# Patient Record
Sex: Female | Born: 1935
Health system: Southern US, Community
[De-identification: ages and names within clinical notes are randomized; demographics above are authoritative.]

## PROBLEM LIST (undated history)

## (undated) ENCOUNTER — Emergency Department (HOSPITAL_COMMUNITY): Discharge status: Absent without leave | Payer: Medicare Other

## (undated) DIAGNOSIS — M1990 Unspecified osteoarthritis, unspecified site: Secondary | ICD-10-CM

---

## 2004-04-08 ENCOUNTER — Ambulatory Visit: Payer: Self-pay | Admitting: Family Medicine

## 2005-04-03 ENCOUNTER — Ambulatory Visit: Payer: Self-pay | Admitting: Family Medicine

## 2006-03-04 ENCOUNTER — Ambulatory Visit: Payer: Self-pay | Admitting: Family Medicine

## 2006-03-04 LAB — CONVERTED CEMR LAB
Basophils Relative: 0.7 % (ref 0.0–1.0)
Calcium: 9.8 mg/dL (ref 8.4–10.5)
Chol/HDL Ratio, serum: 4
Creatinine, Ser: 0.7 mg/dL (ref 0.4–1.2)
Glucose, Bld: 107 mg/dL — ABNORMAL HIGH (ref 70–99)
HCT: 45.5 % (ref 36.0–46.0)
Hemoglobin: 15.3 g/dL — ABNORMAL HIGH (ref 12.0–15.0)
LDL DIRECT: 163.4 mg/dL
Lymphocytes Relative: 20.7 % (ref 12.0–46.0)
Monocytes Absolute: 0.4 10*3/uL (ref 0.2–0.7)
Neutro Abs: 4.4 10*3/uL (ref 1.4–7.7)
Neutrophils Relative %: 71.8 % (ref 43.0–77.0)
Potassium: 3.8 meq/L (ref 3.5–5.1)
RDW: 12.3 % (ref 11.5–14.6)
VLDL: 14 mg/dL (ref 0–40)

## 2006-03-14 ENCOUNTER — Ambulatory Visit: Payer: Self-pay | Admitting: Family Medicine

## 2006-07-28 ENCOUNTER — Ambulatory Visit: Payer: Self-pay | Admitting: Family Medicine

## 2007-02-13 DIAGNOSIS — J45909 Unspecified asthma, uncomplicated: Secondary | ICD-10-CM | POA: Insufficient documentation

## 2007-02-13 DIAGNOSIS — Z8709 Personal history of other diseases of the respiratory system: Secondary | ICD-10-CM | POA: Insufficient documentation

## 2007-02-13 DIAGNOSIS — E785 Hyperlipidemia, unspecified: Secondary | ICD-10-CM | POA: Insufficient documentation

## 2007-02-13 DIAGNOSIS — I10 Essential (primary) hypertension: Secondary | ICD-10-CM | POA: Insufficient documentation

## 2007-03-09 ENCOUNTER — Ambulatory Visit: Payer: Self-pay | Admitting: Family Medicine

## 2007-05-08 ENCOUNTER — Telehealth: Payer: Self-pay | Admitting: Family Medicine

## 2007-05-15 ENCOUNTER — Telehealth: Payer: Self-pay | Admitting: Family Medicine

## 2008-01-03 ENCOUNTER — Ambulatory Visit: Payer: Self-pay | Admitting: Family Medicine

## 2008-01-03 DIAGNOSIS — L03119 Cellulitis of unspecified part of limb: Secondary | ICD-10-CM

## 2008-01-03 DIAGNOSIS — L02519 Cutaneous abscess of unspecified hand: Secondary | ICD-10-CM | POA: Insufficient documentation

## 2008-02-15 ENCOUNTER — Telehealth: Payer: Self-pay | Admitting: Family Medicine

## 2008-02-28 ENCOUNTER — Ambulatory Visit: Payer: Self-pay | Admitting: Family Medicine

## 2009-03-05 ENCOUNTER — Ambulatory Visit: Payer: Self-pay | Admitting: Family Medicine

## 2009-04-15 ENCOUNTER — Ambulatory Visit: Payer: Self-pay | Admitting: Family Medicine

## 2009-04-15 LAB — CONVERTED CEMR LAB
Nitrite: NEGATIVE
Specific Gravity, Urine: 1.02
Urobilinogen, UA: 0.2

## 2009-04-16 LAB — CONVERTED CEMR LAB
ALT: 19 units/L (ref 0–35)
BUN: 10 mg/dL (ref 6–23)
Basophils Relative: 0.2 % (ref 0.0–3.0)
CO2: 29 meq/L (ref 19–32)
Chloride: 103 meq/L (ref 96–112)
Eosinophils Relative: 0.6 % (ref 0.0–5.0)
HCT: 42.7 % (ref 36.0–46.0)
Lymphs Abs: 1 10*3/uL (ref 0.7–4.0)
MCV: 93.3 fL (ref 78.0–100.0)
Monocytes Absolute: 0.4 10*3/uL (ref 0.1–1.0)
Platelets: 169 10*3/uL (ref 150.0–400.0)
Potassium: 3.7 meq/L (ref 3.5–5.1)
TSH: 2.73 microintl units/mL (ref 0.35–5.50)
Total Bilirubin: 1.3 mg/dL — ABNORMAL HIGH (ref 0.3–1.2)
Total Protein: 7.4 g/dL (ref 6.0–8.3)
WBC: 6 10*3/uL (ref 4.5–10.5)

## 2010-02-11 ENCOUNTER — Ambulatory Visit: Payer: Self-pay | Admitting: Internal Medicine

## 2010-06-03 ENCOUNTER — Ambulatory Visit
Admission: RE | Admit: 2010-06-03 | Discharge: 2010-06-03 | Payer: Self-pay | Source: Home / Self Care | Attending: Family Medicine | Admitting: Family Medicine

## 2010-06-05 ENCOUNTER — Ambulatory Visit
Admission: RE | Admit: 2010-06-05 | Discharge: 2010-06-05 | Payer: Self-pay | Source: Home / Self Care | Attending: Family Medicine | Admitting: Family Medicine

## 2010-06-25 NOTE — Assessment & Plan Note (Signed)
Summary: FLU SHOT // RS  Nurse Visit   Vitals Entered By: Duard Brady LPN (February 11, 2010 3:03 PM)  Allergies: No Known Drug Allergies  Orders Added: 1)  Flu Vaccine 43yrs + MEDICARE PATIENTS [Q2039] 2)  Administration Flu vaccine - MCR [G0008] Flu Vaccine Consent Questions     Do you have a history of severe allergic reactions to this vaccine? no    Any prior history of allergic reactions to egg and/or gelatin? no    Do you have a sensitivity to the preservative Thimersol? no    Do you have a past history of Guillan-Barre Syndrome? no    Do you currently have an acute febrile illness? no    Have you ever had a severe reaction to latex? no    Vaccine information given and explained to patient? yes    Are you currently pregnant? no    Lot Number:[id number]   Exp Date:11/21/2010   Site Given  Left Deltoid IM.lbmedflu

## 2010-06-25 NOTE — Assessment & Plan Note (Signed)
Summary: CHEST CONGESTION/COUGH/NO FEVER/CJR   Vital Signs:  Patient profile:   77 year old female Height:      58 inches Weight:      115 pounds BMI:     24.12 Temp:     98.0 degrees F oral BP sitting:   120 / 80  (left arm) Cuff size:   regular  Vitals Entered By: Kern Reap CMA Duncan Dull) (June 03, 2010 10:31 AM) CC: cough   CC:  cough.  History of Present Illness: Danielle Myers is an 77 year old single female, who comes in today with a week's history of cough.  She's had no fever, earache, sore throat, etc.  She does have a history of allergic rhinitis and asthma in the past.  She cannot recall anything that triggered her asthma  Allergies: No Known Drug Allergies  Past History:  Past medical, surgical, family and social histories (including risk factors) reviewed for relevance to current acute and chronic problems.  Past Medical History: Reviewed history from 02/13/2007 and no changes required. Macular Degeneration L eye L ear hearing loss Allergies Asthma Hyperlipidemia Hypertension Pneumonia, hx of  Past Surgical History: Reviewed history from 02/13/2007 and no changes required. Sigmoidoscopy Appendectomy  Family History: Reviewed history from 02/13/2007 and no changes required. Family History of Cardiovascular disorder  Social History: Reviewed history from 02/13/2007 and no changes required. Retired Divorced Never Smoked Alcohol use-yes Regular exercise-yes  Review of Systems      See HPI  Physical Exam  General:  Well-developed,well-nourished,in no acute distress; alert,appropriate and cooperative throughout examination Head:  Normocephalic and atraumatic without obvious abnormalities. No apparent alopecia or balding. Eyes:  No corneal or conjunctival inflammation noted. EOMI. Perrla. Funduscopic exam benign, without hemorrhages, exudates or papilledema. Vision grossly normal. Ears:  External ear exam shows no significant lesions or deformities.   Otoscopic examination reveals clear canals, tympanic membranes are intact bilaterally without bulging, retraction, inflammation or discharge. Hearing is grossly normal bilaterally. Nose:  External nasal examination shows no deformity or inflammation. Nasal mucosa are pink and moist without lesions or exudates. Mouth:  Oral mucosa and oropharynx without lesions or exudates.  Teeth in good repair. Neck:  No deformities, masses, or tenderness noted. Chest Wall:  No deformities, masses, or tenderness noted. Lungs:  symmetrical breath sounds, expiratory wheezing bilaterally   Impression & Recommendations:  Problem # 1:  ASTHMA (ICD-493.90) Assessment Deteriorated  Her updated medication list for this problem includes:    Prednisone 20 Mg Tabs (Prednisone) ..... Uad  Complete Medication List: 1)  Hydrochlorothiazide 12.5 Mg Tabs (Hydrochlorothiazide) .... Take 1 tablet by mouth once a day ; cpx when due 2)  Baby Aspirin 81 Mg Chew (Aspirin) 3)  Nasonex 50 Mcg/act Susp (Mometasone furoate) .... 2 puffs per nostrile when needed 4)  Prednisone 20 Mg Tabs (Prednisone) .... Uad 5)  Hydromet 5-1.5 Mg/16ml Syrp (Hydrocodone-homatropine) .... 1/2 tsp three times a day as needed  Patient Instructions: 1)  begin prednisone, take two tablets daily x 3 days, one x 3 days, a half x 3 days, then half a tablet Monday, Wednesday, Friday, for a two week taper. 2)  Drink 20 ounces of water daily. 3)  Hydromet 0.5-teaspoon 3 times a day as needed for cough. 4)  Return Friday for follow-up Prescriptions: HYDROMET 5-1.5 MG/5ML SYRP (HYDROCODONE-HOMATROPINE) 1/2 tsp three times a day as needed  #4oz x 1   Entered and Authorized by:   Roderick Pee MD   Signed by:   Roderick Pee  MD on 06/03/2010   Method used:   Print then Mail to Patient   RxID:   [id number] PREDNISONE 20 MG TABS (PREDNISONE) UAD  #30 x 1   Entered and Authorized by:   Roderick Pee MD   Signed by:   Roderick Pee MD on  06/03/2010   Method used:   Electronically to        [facility]  [phone]* (retail)       [address]       [place], [place]  [address]       Ph: [phone] or [phone]       Fax: [phone]   RxID:   [id number]    Orders Added: 1)  Est. Patient Level IV [87564]

## 2010-06-25 NOTE — Assessment & Plan Note (Signed)
Summary: follow up//alp   Vital Signs:  Patient profile:   77 year old female Weight:      116 pounds Temp:     98.6 degrees F oral BP sitting:   100 / 70  (left arm) Cuff size:   regular  Vitals Entered By: Romualdo Bolk, CMA (AAMA) (June 05, 2010 3:11 PM) CC: follow-up visit   CC:  follow-up visit.  History of Present Illness: Danielle Myers is a 77 year old female, who comes back today for follow-up of a viral syndrome, which triggered her asthma.  We start on prednisone 40 mg daily 3 days ago.  She comes back saying she feels much improved.  No coughing no wheezing  Preventive Screening-Counseling & Management  Alcohol-Tobacco     Smoking Status: never  Caffeine-Diet-Exercise     Does Patient Exercise: yes  Current Medications (verified): 1)  Hydrochlorothiazide 12.5 Mg  Tabs (Hydrochlorothiazide) .... Take 1 Tablet By Mouth Once A Day ; Cpx When Due 2)  Baby Aspirin 81 Mg  Chew (Aspirin) 3)  Nasonex 50 Mcg/act Susp (Mometasone Furoate) .... 2 Puffs Per Nostrile When Needed 4)  Prednisone 20 Mg Tabs (Prednisone) .... Uad 5)  Hydromet 5-1.5 Mg/44ml Syrp (Hydrocodone-Homatropine) .... 1/2 Tsp Three Times A Day As Needed  Allergies (verified): No Known Drug Allergies  Past History:  Past medical, surgical, family and social histories (including risk factors) reviewed for relevance to current acute and chronic problems.  Past Medical History: Reviewed history from 02/13/2007 and no changes required. Macular Degeneration L eye L ear hearing loss Allergies Asthma Hyperlipidemia Hypertension Pneumonia, hx of  Past Surgical History: Reviewed history from 02/13/2007 and no changes required. Sigmoidoscopy Appendectomy  Family History: Reviewed history from 02/13/2007 and no changes required. Family History of Cardiovascular disorder  Social History: Reviewed history from 02/13/2007 and no changes required. Retired Divorced Never Smoked Alcohol  use-yes Regular exercise-yes  Review of Systems      See HPI  Physical Exam  General:  Well-developed,well-nourished,in no acute distress; alert,appropriate and cooperative throughout examination Lungs:  Normal respiratory effort, chest expands symmetrically. Lungs are clear to auscultation, no crackles or wheezes.   Impression & Recommendations:  Problem # 1:  ASTHMA (ICD-493.90) Assessment Improved  Her updated medication list for this problem includes:    Prednisone 20 Mg Tabs (Prednisone) ..... Uad  Complete Medication List: 1)  Hydrochlorothiazide 12.5 Mg Tabs (Hydrochlorothiazide) .... Take 1 tablet by mouth once a day ; cpx when due 2)  Baby Aspirin 81 Mg Chew (Aspirin) 3)  Nasonex 50 Mcg/act Susp (Mometasone furoate) .... 2 puffs per nostrile when needed 4)  Prednisone 20 Mg Tabs (Prednisone) .... Uad 5)  Hydromet 5-1.5 Mg/27ml Syrp (Hydrocodone-homatropine) .... 1/2 tsp three times a day as needed  Patient Instructions: 1)  take one prednisone tablet daily, x 3 days, then a half a tablet daily, x 3 days, then stop. 2)  Please schedule a follow-up appointment in 1 year.   Orders Added: 1)  Est. Patient Level III [82956]

## 2010-07-14 ENCOUNTER — Other Ambulatory Visit: Payer: Self-pay | Admitting: Family Medicine

## 2010-09-08 ENCOUNTER — Ambulatory Visit (INDEPENDENT_AMBULATORY_CARE_PROVIDER_SITE_OTHER): Payer: Medicare Other | Admitting: Family Medicine

## 2010-09-08 ENCOUNTER — Encounter: Payer: Self-pay | Admitting: Family Medicine

## 2010-09-08 VITALS — BP 120/80 | Temp 97.8°F | Ht 60.0 in | Wt 116.0 lb

## 2010-09-08 DIAGNOSIS — L255 Unspecified contact dermatitis due to plants, except food: Secondary | ICD-10-CM

## 2010-09-08 DIAGNOSIS — L237 Allergic contact dermatitis due to plants, except food: Secondary | ICD-10-CM

## 2010-09-08 MED ORDER — PREDNISONE 20 MG PO TABS: ORAL_TABLET | ORAL | Status: DC

## 2010-09-08 NOTE — Patient Instructions (Signed)
Take the prednisone as directed.  Return p.r.n. Also apply small amounts of over-the-counter cortisone cream 3 times daily

## 2010-09-08 NOTE — Progress Notes (Signed)
  Subjective:    Patient ID: Danielle Myers, female    DOB: 02-27-1919, 77 y.o.   MRN: [id number]  HPIAlice is a 77 year old female, who comes in today for a contact dermatitis.  That will not resolve with topical creams for the last two weeks    Review of Systems General and dermatologic review of systems otherwise negative    Objective:   Physical Exam    Well-developed well-nourished, female, in no acute distress.  Examination.  Skin shows diffuse contact dermatitis.  Both forearms    Assessment & Plan:  Contact dermatitis,,,,,,,,,,,,,, prednisone burst and taper return to

## 2011-01-12 ENCOUNTER — Other Ambulatory Visit: Payer: Self-pay | Admitting: Family Medicine

## 2011-03-16 ENCOUNTER — Ambulatory Visit (INDEPENDENT_AMBULATORY_CARE_PROVIDER_SITE_OTHER): Payer: Medicare Other

## 2011-03-16 DIAGNOSIS — Z23 Encounter for immunization: Secondary | ICD-10-CM

## 2011-04-08 ENCOUNTER — Encounter: Payer: Self-pay | Admitting: Internal Medicine

## 2011-04-08 ENCOUNTER — Ambulatory Visit (INDEPENDENT_AMBULATORY_CARE_PROVIDER_SITE_OTHER): Payer: Medicare Other | Admitting: Internal Medicine

## 2011-04-08 DIAGNOSIS — J069 Acute upper respiratory infection, unspecified: Secondary | ICD-10-CM

## 2011-04-08 DIAGNOSIS — J45909 Unspecified asthma, uncomplicated: Secondary | ICD-10-CM

## 2011-04-08 DIAGNOSIS — I10 Essential (primary) hypertension: Secondary | ICD-10-CM

## 2011-04-08 MED ORDER — HYDROCODONE-HOMATROPINE 5-1.5 MG/5ML PO SYRP: 2.5000 mL | ORAL_SOLUTION | Freq: Four times a day (QID) | ORAL | Status: AC | PRN

## 2011-04-08 NOTE — Patient Instructions (Signed)
Get plenty of rest, Drink lots of  clear liquids, and use Tylenol or ibuprofen for fever and discomfort.    Mucinex DM one tablet twice daily  Past medications as directed  Call or return to clinic prn if these symptoms worsen or fail to improve as anticipated.

## 2011-04-08 NOTE — Progress Notes (Signed)
Subjective:    Patient ID: Danielle Myers, female    DOB: September 20, 1918, 77 y.o.   MRN: [id number]  HPI  77 year old patient who is seen today for followup. She was seen at an urgent care 3 days ago and treated with prednisone orally briefly. She is seen today with a chief complaint of persistent cough. She's also had mild sore throat. There is slight postnasal drip but no sputum production. Denies any chest pain shortness of breath fever or chills. She was also prescribed Nasonex.  No past medical history on file.  History   Social History  . Marital Status: Single    Spouse Name: N/A    Number of Children: N/A  . Years of Education: N/A   Occupational History  . Not on file.   Social History Main Topics  . Smoking status: Never Smoker   . Smokeless tobacco: Not on file  . Alcohol Use: Yes  . Drug Use:   . Sexually Active:    Other Topics Concern  . Not on file   Social History Narrative  . No narrative on file    No past surgical history on file.  No family history on file.  No Known Allergies  Current Outpatient Prescriptions on File Prior to Visit  Medication Sig Dispense Refill  . aspirin 81 MG EC tablet Take 81 mg by mouth daily.        . hydrochlorothiazide (,MICROZIDE/HYDRODIURIL,) 12.5 MG capsule TAKE ONE CAPSULE BY MOUTH EVERY DAY  90 capsule  1  . mometasone (NASONEX) 50 MCG/ACT nasal spray 2 sprays by Nasal route daily.        . predniSONE (DELTASONE) 20 MG tablet Two tabs x 3 days, one x 3 days, a half x 3 days, then half a tablet Monday, Wednesday, Friday, for a two week taper  40 tablet  1    BP 110/80  Pulse 88  Temp(Src) 98.1 F (36.7 C) (Oral)  Wt 114 lb (51.71 kg)  SpO2 96%      Review of Systems  Constitutional: Positive for fatigue. Negative for fever and chills.  HENT: Positive for congestion, sore throat and postnasal drip. Negative for hearing loss, rhinorrhea, dental problem, sinus pressure and tinnitus.   Eyes: Negative for pain,  discharge and visual disturbance.  Respiratory: Positive for cough. Negative for shortness of breath.   Cardiovascular: Negative for chest pain, palpitations and leg swelling.  Gastrointestinal: Negative for nausea, vomiting, abdominal pain, diarrhea, constipation, blood in stool and abdominal distention.  Genitourinary: Negative for dysuria, urgency, frequency, hematuria, flank pain, vaginal bleeding, vaginal discharge, difficulty urinating, vaginal pain and pelvic pain.  Musculoskeletal: Negative for joint swelling, arthralgias and gait problem.  Skin: Negative for rash.  Neurological: Negative for dizziness, syncope, speech difficulty, weakness, numbness and headaches.  Hematological: Negative for adenopathy.  Psychiatric/Behavioral: Negative for behavioral problems, dysphoric mood and agitation. The patient is not nervous/anxious.        Objective:   Physical Exam  Constitutional: She is oriented to person, place, and time. She appears well-developed and well-nourished. No distress.       Afebrile. No distress. Easily transfers from a sitting position to the examining table unassisted  HENT:  Head: Normocephalic.  Right Ear: External ear normal.  Left Ear: External ear normal.  Mouth/Throat: Oropharynx is clear and moist.  Eyes: Conjunctivae and EOM are normal. Pupils are equal, round, and reactive to light.  Neck: Normal range of motion. Neck supple. No thyromegaly present.  Cardiovascular: Normal  rate, regular rhythm, normal heart sounds and intact distal pulses.   Pulmonary/Chest: Effort normal and breath sounds normal.       O2 saturation 97  Abdominal: Soft. Bowel sounds are normal. She exhibits no mass. There is no tenderness.  Musculoskeletal: Normal range of motion.  Lymphadenopathy:    She has no cervical adenopathy.  Neurological: She is alert and oriented to person, place, and time.  Skin: Skin is warm and dry. No rash noted.  Psychiatric: She has a normal mood and  affect. Her behavior is normal.          Assessment & Plan:    URI with persistent cough. The patient was treated with Hydromet earlier in the year at a reduced dose which she seemed to tolerate well and was quite helpful. Will refill. Will continue Tylenol or Advil for sore throat. We'll call if unimproved  Hypertension stable. We'll continue diuretic therapy

## 2011-04-09 ENCOUNTER — Ambulatory Visit: Payer: Medicare Other | Admitting: Internal Medicine

## 2011-04-11 ENCOUNTER — Other Ambulatory Visit: Payer: Self-pay | Admitting: Family Medicine

## 2011-06-28 ENCOUNTER — Other Ambulatory Visit: Payer: Self-pay | Admitting: Family Medicine

## 2011-12-27 ENCOUNTER — Other Ambulatory Visit: Payer: Self-pay | Admitting: Family Medicine

## 2012-03-07 ENCOUNTER — Ambulatory Visit (INDEPENDENT_AMBULATORY_CARE_PROVIDER_SITE_OTHER): Payer: Medicare Other

## 2012-03-07 DIAGNOSIS — Z23 Encounter for immunization: Secondary | ICD-10-CM

## 2012-03-17 ENCOUNTER — Other Ambulatory Visit: Payer: Self-pay | Admitting: Family Medicine

## 2012-08-15 ENCOUNTER — Ambulatory Visit (INDEPENDENT_AMBULATORY_CARE_PROVIDER_SITE_OTHER): Payer: Medicare Other | Admitting: Internal Medicine

## 2012-08-15 ENCOUNTER — Encounter: Payer: Self-pay | Admitting: Internal Medicine

## 2012-08-15 VITALS — BP 130/80 | HR 68 | Temp 98.0°F | Resp 18 | Wt 116.0 lb

## 2012-08-15 DIAGNOSIS — J45909 Unspecified asthma, uncomplicated: Secondary | ICD-10-CM

## 2012-08-15 DIAGNOSIS — E785 Hyperlipidemia, unspecified: Secondary | ICD-10-CM

## 2012-08-15 DIAGNOSIS — I10 Essential (primary) hypertension: Secondary | ICD-10-CM

## 2012-08-15 MED ORDER — HYDROCHLOROTHIAZIDE 12.5 MG PO CAPS: ORAL_CAPSULE | ORAL | Status: DC

## 2012-08-15 MED ORDER — MOMETASONE FUROATE 50 MCG/ACT NA SUSP: 2.0000 | Freq: Every day | NASAL | Status: DC

## 2012-08-15 NOTE — Progress Notes (Signed)
Subjective:    Patient ID: Danielle Myers, female    DOB: [birth year], 79 y.o.   MRN: [id number]  HPI  79 year old patient who has not been seen here in over one year. She does remarkably well. She has hypertension treated with diuretic therapy and also has a mild history of dyslipidemia and asthma. She is doing quite well without concerns or complaints. She states that she stays quite busy and denies any exercise limitations. Denies any cardiopulmonary complaints. She also takes low-dose aspirin  History reviewed. No pertinent past medical history.  History   Social History  . Marital Status: Single    Spouse Name: N/A    Number of Children: N/A  . Years of Education: N/A   Occupational History  . Not on file.   Social History Main Topics  . Smoking status: Never Smoker   . Smokeless tobacco: Not on file  . Alcohol Use: Yes  . Drug Use:   . Sexually Active:    Other Topics Concern  . Not on file   Social History Narrative  . No narrative on file    History reviewed. No pertinent past surgical history.  No family history on file.  No Known Allergies  Current Outpatient Prescriptions on File Prior to Visit  Medication Sig Dispense Refill  . aspirin 81 MG EC tablet Take 81 mg by mouth daily.       . hydrochlorothiazide (MICROZIDE) 12.5 MG capsule TAKE ONE CAPSULE BY MOUTH EVERY DAY  30 capsule  0  . mometasone (NASONEX) 50 MCG/ACT nasal spray 2 sprays by Nasal route daily.         No current facility-administered medications on file prior to visit.    BP 130/80  Pulse 68  Temp(Src) 98 F (36.7 C) (Oral)  Resp 18  Wt 116 lb (52.617 kg)  BMI 22.65 kg/m2  SpO2 98%       Review of Systems  Constitutional: Negative.   HENT: Negative for hearing loss, congestion, sore throat, rhinorrhea, dental problem, sinus pressure and tinnitus.   Eyes: Negative for pain, discharge and visual disturbance.  Respiratory: Negative for cough and shortness of breath.    Cardiovascular: Negative for chest pain, palpitations and leg swelling.  Gastrointestinal: Negative for nausea, vomiting, abdominal pain, diarrhea, constipation, blood in stool and abdominal distention.  Genitourinary: Negative for dysuria, urgency, frequency, hematuria, flank pain, vaginal bleeding, vaginal discharge, difficulty urinating, vaginal pain and pelvic pain.  Musculoskeletal: Negative for joint swelling, arthralgias and gait problem.  Skin: Negative for rash.  Neurological: Negative for dizziness, syncope, speech difficulty, weakness, numbness and headaches.  Hematological: Negative for adenopathy.  Psychiatric/Behavioral: Negative for behavioral problems, dysphoric mood and agitation. The patient is not nervous/anxious.        Objective:   Physical Exam  Constitutional: She is oriented to person, place, and time. She appears well-developed and well-nourished.  HENT:  Head: Normocephalic.  Right Ear: External ear normal.  Left Ear: External ear normal.  Mouth/Throat: Oropharynx is clear and moist.  Eyes: Conjunctivae and EOM are normal. Pupils are equal, round, and reactive to light.  Neck: Normal range of motion. Neck supple. No thyromegaly present.  Cardiovascular: Normal rate, regular rhythm, normal heart sounds and intact distal pulses.   Pulmonary/Chest: Effort normal and breath sounds normal.  Abdominal: Soft. Bowel sounds are normal. She exhibits no mass. There is no tenderness.  Musculoskeletal: Normal range of motion.  Lymphadenopathy:    She has no cervical adenopathy.  Neurological: She is  alert and oriented to person, place, and time.  Skin: Skin is warm and dry. No rash noted.  Psychiatric: She has a normal mood and affect. Her behavior is normal.          Assessment & Plan:   Hypertension. Well controlled on diuretic therapy. Will refill History of asthma clinically stable History mild dyslipidemia.  Patient was asked return in the fall for a flu  vaccine and annual exam Medications refilled

## 2012-08-15 NOTE — Patient Instructions (Signed)
Limit your sodium (Salt) intake    It is important that you exercise regularly, at least 20 minutes 3 to 4 times per week.  If you develop chest pain or shortness of breath seek  medical attention.  Please check your blood pressure on a regular basis.  If it is consistently greater than 150/90, please make an office appointment.  Return in 6 months for follow-up   

## 2013-11-13 ENCOUNTER — Other Ambulatory Visit: Payer: Self-pay | Admitting: Internal Medicine

## 2013-12-06 ENCOUNTER — Telehealth: Payer: Self-pay | Admitting: Family Medicine

## 2013-12-06 MED ORDER — HYDROCHLOROTHIAZIDE 12.5 MG PO CAPS: ORAL_CAPSULE | ORAL | Status: DC

## 2013-12-06 NOTE — Telephone Encounter (Signed)
[facility] [place] - [address] Is requesting re-fill on hydrochlorothiazide (MICROZIDE) 12.5 MG capsule

## 2014-02-13 ENCOUNTER — Other Ambulatory Visit: Payer: Self-pay | Admitting: Internal Medicine

## 2014-06-18 ENCOUNTER — Other Ambulatory Visit: Payer: Self-pay | Admitting: Family Medicine

## 2015-02-09 ENCOUNTER — Emergency Department (HOSPITAL_COMMUNITY): Payer: Medicare Other

## 2015-02-09 ENCOUNTER — Emergency Department (HOSPITAL_COMMUNITY)
Admission: EM | Admit: 2015-02-09 | Discharge: 2015-02-09 | Disposition: A | Discharge status: Home / Self Care | Payer: Medicare Other | Attending: Emergency Medicine | Admitting: Emergency Medicine

## 2015-02-09 ENCOUNTER — Encounter (HOSPITAL_COMMUNITY): Payer: Self-pay | Admitting: Emergency Medicine

## 2015-02-09 DIAGNOSIS — Z7951 Long term (current) use of inhaled steroids: Secondary | ICD-10-CM | POA: Diagnosis not present

## 2015-02-09 DIAGNOSIS — Z79899 Other long term (current) drug therapy: Secondary | ICD-10-CM | POA: Diagnosis not present

## 2015-02-09 DIAGNOSIS — M1990 Unspecified osteoarthritis, unspecified site: Secondary | ICD-10-CM | POA: Diagnosis not present

## 2015-02-09 DIAGNOSIS — Z7982 Long term (current) use of aspirin: Secondary | ICD-10-CM | POA: Diagnosis not present

## 2015-02-09 DIAGNOSIS — R410 Disorientation, unspecified: Secondary | ICD-10-CM

## 2015-02-09 DIAGNOSIS — R4182 Altered mental status, unspecified: Secondary | ICD-10-CM | POA: Diagnosis present

## 2015-02-09 HISTORY — DX: Unspecified osteoarthritis, unspecified site: M19.90

## 2015-02-09 LAB — URINALYSIS, ROUTINE W REFLEX MICROSCOPIC
Bilirubin Urine: NEGATIVE
GLUCOSE, UA: NEGATIVE mg/dL
Hgb urine dipstick: NEGATIVE
Ketones, ur: NEGATIVE mg/dL
LEUKOCYTES UA: NEGATIVE
Nitrite: NEGATIVE
PH: 7.5 (ref 5.0–8.0)
PROTEIN: NEGATIVE mg/dL
SPECIFIC GRAVITY, URINE: 1.008 (ref 1.005–1.030)
Urobilinogen, UA: 0.2 mg/dL (ref 0.0–1.0)

## 2015-02-09 LAB — I-STAT CHEM 8, ED
BUN: 14 mg/dL (ref 6–20)
CHLORIDE: 105 mmol/L (ref 101–111)
Calcium, Ion: 1.26 mmol/L (ref 1.13–1.30)
Creatinine, Ser: 0.8 mg/dL (ref 0.44–1.00)
GLUCOSE: 97 mg/dL (ref 65–99)
HCT: 44 % (ref 36.0–46.0)
HEMOGLOBIN: 15 g/dL (ref 12.0–15.0)
POTASSIUM: 4 mmol/L (ref 3.5–5.1)
SODIUM: 142 mmol/L (ref 135–145)
TCO2: 25 mmol/L (ref 0–100)

## 2015-02-09 LAB — DIFFERENTIAL
BASOS ABS: 0 10*3/uL (ref 0.0–0.1)
BASOS PCT: 0 %
EOS ABS: 0.1 10*3/uL (ref 0.0–0.7)
Eosinophils Relative: 1 %
Lymphocytes Relative: 22 %
Lymphs Abs: 1.4 10*3/uL (ref 0.7–4.0)
MONO ABS: 0.6 10*3/uL (ref 0.1–1.0)
MONOS PCT: 10 %
NEUTROS ABS: 4.2 10*3/uL (ref 1.7–7.7)
Neutrophils Relative %: 67 %

## 2015-02-09 LAB — I-STAT TROPONIN, ED: TROPONIN I, POC: 0.01 ng/mL (ref 0.00–0.08)

## 2015-02-09 LAB — RAPID URINE DRUG SCREEN, HOSP PERFORMED
AMPHETAMINES: NOT DETECTED
BENZODIAZEPINES: NOT DETECTED
Barbiturates: NOT DETECTED
COCAINE: NOT DETECTED
OPIATES: NOT DETECTED
Tetrahydrocannabinol: NOT DETECTED

## 2015-02-09 LAB — COMPREHENSIVE METABOLIC PANEL
ALT: 16 U/L (ref 14–54)
AST: 22 U/L (ref 15–41)
Albumin: 3.9 g/dL (ref 3.5–5.0)
Alkaline Phosphatase: 68 U/L (ref 38–126)
Anion gap: 7 (ref 5–15)
BUN: 10 mg/dL (ref 6–20)
CHLORIDE: 107 mmol/L (ref 101–111)
CO2: 27 mmol/L (ref 22–32)
Calcium: 9.6 mg/dL (ref 8.9–10.3)
Creatinine, Ser: 0.79 mg/dL (ref 0.44–1.00)
Glucose, Bld: 100 mg/dL — ABNORMAL HIGH (ref 65–99)
POTASSIUM: 4 mmol/L (ref 3.5–5.1)
SODIUM: 141 mmol/L (ref 135–145)
Total Bilirubin: 0.6 mg/dL (ref 0.3–1.2)
Total Protein: 6.7 g/dL (ref 6.5–8.1)

## 2015-02-09 LAB — APTT: APTT: 32 s (ref 24–37)

## 2015-02-09 LAB — CBC
HEMATOCRIT: 42.3 % (ref 36.0–46.0)
Hemoglobin: 14 g/dL (ref 12.0–15.0)
MCH: 30.2 pg (ref 26.0–34.0)
MCHC: 33.1 g/dL (ref 30.0–36.0)
MCV: 91.2 fL (ref 78.0–100.0)
PLATELETS: 187 10*3/uL (ref 150–400)
RBC: 4.64 MIL/uL (ref 3.87–5.11)
RDW: 13.2 % (ref 11.5–15.5)
WBC: 6.3 10*3/uL (ref 4.0–10.5)

## 2015-02-09 LAB — ETHANOL

## 2015-02-09 LAB — PROTIME-INR
INR: 1.11 (ref 0.00–1.49)
PROTHROMBIN TIME: 14.5 s (ref 11.6–15.2)

## 2015-02-09 MED ORDER — SODIUM CHLORIDE 0.9 % IV SOLN
1000.0000 mg | Freq: Once | INTRAVENOUS | Status: AC
  Administered 2015-02-09: 1000 mg via INTRAVENOUS
  Filled 2015-02-09: qty 10

## 2015-02-09 NOTE — ED Provider Notes (Signed)
Assumed care for the patient for Dr. Clayborne Dana to follow up MRI result with planned D/C if negative. MRI negative. Reviewed HPI. Patient has very well appearance. Sounds unlikely to be seizure by HPI, will D/C patient without Keppra and advise observation by companion present and outpatient follow up.  Arby Barrette, MD 02/09/15 [phone]

## 2015-02-09 NOTE — Discharge Instructions (Signed)
Confusion Confusion is the inability to think with your usual speed or clarity. Confusion may come on quickly or slowly over time. How quickly the confusion comes on depends on the cause. Confusion can be due to any number of causes. CAUSES   Concussion, head injury, or head trauma.  Seizures.  Stroke.  Fever.  Brain tumor.  Age related decreased brain function (dementia).  Heightened emotional states like rage or terror.  Mental illness in which the person loses the ability to determine what is real and what is not (hallucinations).  Infections such as a urinary tract infection (UTI).  Toxic effects from alcohol, drugs, or prescription medicines.  Dehydration and an imbalance of salts in the body (electrolytes).  Lack of sleep.  Low blood sugar (diabetes).  Low levels of oxygen from conditions such as chronic lung disorders.  Drug interactions or other medicine side effects.  Nutritional deficiencies, especially niacin, thiamine, vitamin C, or vitamin B.  Sudden drop in body temperature (hypothermia).  Change in routine, such as when traveling or hospitalized. SIGNS AND SYMPTOMS  People often describe their thinking as cloudy or unclear when they are confused. Confusion can also include feeling disoriented. That means you are unaware of where or who you are. You may also not know what the date or time is. If confused, you may also have difficulty paying attention, remembering, and making decisions. Some people also act aggressively when they are confused.  DIAGNOSIS  The medical evaluation of confusion may include:  Blood and urine tests.  X-rays.  Brain and nervous system tests.  Analyzing your brain waves (electroencephalogram or EEG).  Magnetic resonance imaging (MRI) of your head.  Computed tomography (CT) scan of your head.  Mental status tests in which your health care provider may ask many questions. Some of these questions may seem silly or strange,  but they are a very important test to help diagnose and treat confusion. TREATMENT  An admission to the hospital may not be needed, but a person with confusion should not be left alone. Stay with a family member or friend until the confusion clears. Avoid alcohol, pain relievers, or sedative drugs until you have fully recovered. Do not drive until directed by your health care provider. HOME CARE INSTRUCTIONS  What family and friends can do:  To find out if someone is confused, ask the person to state his or her name, age, and the date. If the person is unsure or answers incorrectly, he or she is confused.  Always introduce yourself, no matter how well the person knows you.  Often remind the person of his or her location.  Place a calendar and clock near the confused person.  Help the person with his or her medicines. You may want to use a pill box, an alarm as a reminder, or give the person each dose as prescribed.  Talk about current events and plans for the day.  Try to keep the environment calm, quiet, and peaceful.  Make sure the person keeps follow-up visits with his or her health care provider. PREVENTION  Ways to prevent confusion:  Avoid alcohol.  Eat a balanced diet.  Get enough sleep.  Take medicine only as directed by your health care provider.  Do not become isolated. Spend time with other people and make plans for your days.  Keep careful watch on your blood sugar levels if you are diabetic. SEEK IMMEDIATE MEDICAL CARE IF:   You develop severe headaches, repeated vomiting, seizures, blackouts, or   slurred speech.  There is increasing confusion, weakness, numbness, restlessness, or personality changes.  You develop a loss of balance, have marked dizziness, feel uncoordinated, or fall.  You have delusions, hallucinations, or develop severe anxiety.  Your family members think you need to be rechecked. Document Released: 06/17/2004 Document Revised: 09/24/2013  Document Reviewed: 06/15/2013 Exit[employer] Patient Information 2015 [employer]. This information is not intended to replace advice given to you by your health care provider. Make sure you discuss any questions you have with your health care provider.  

## 2015-02-09 NOTE — ED Notes (Signed)
Patient returned from MRI.

## 2015-02-09 NOTE — ED Notes (Signed)
Patient transported to MRI 

## 2015-02-09 NOTE — ED Notes (Signed)
Patient transported to CT 

## 2015-02-09 NOTE — ED Provider Notes (Addendum)
CSN: [id number]     Arrival date & time 02/09/15  1309 History   First MD Initiated Contact with Patient 02/09/15 1312     Chief Complaint  Patient presents with  . Altered Mental Status     (Consider location/radiation/quality/duration/timing/severity/associated sxs/prior Treatment) Patient is a 81 y.o. female presenting with neurologic complaint.  Neurologic Problem Pertinent negatives include no chest pain, no abdominal pain, no headaches and no shortness of breath.    81 year old female had a period time where she would answer questions appropriately which is Saying I am okay I am okay to everything no matter What was asked. Friend called EMS when EMS arrived patient was acting same way. And then during the ride here patient became more interactive and started talking more. Here the patient answers questions appropriately. States she doesn't have any pain anywhere and she feels "you are making too much out of nothing". No recent fevers, vomiting, abdominal pain, chest pain or other symptoms. No history fo same per patient or her neighbor at bedside.   Past Medical History  Diagnosis Date  . Arthritis    History reviewed. No pertinent past surgical history. History reviewed. No pertinent family history. Social History  Substance Use Topics  . Smoking status: Never Smoker   . Smokeless tobacco: None  . Alcohol Use: Yes   OB History    No data available     Review of Systems  Constitutional: Negative for fever and activity change.  HENT: Negative for congestion and facial swelling.   Eyes: Negative for discharge and redness.  Respiratory: Negative for cough and shortness of breath.   Cardiovascular: Negative for chest pain and palpitations.  Gastrointestinal: Negative for abdominal pain and abdominal distention.  Endocrine: Negative for polydipsia and polyuria.  Genitourinary: Negative for dysuria and menstrual problem.  Musculoskeletal: Negative for back pain and joint  swelling.  Skin: Negative for color change and wound.  Neurological: Negative for dizziness, tremors, light-headedness and headaches.      Allergies  Review of patient's allergies indicates no known allergies.  Home Medications   Prior to Admission medications   Medication Sig Start Date End Date Taking? Authorizing Provider  aspirin 81 MG EC tablet Take 81 mg by mouth daily.    Yes Historical Provider, MD  Multiple Vitamin (MULTIVITAMIN) tablet Take 1 tablet by mouth daily.   Yes Historical Provider, MD  hydrochlorothiazide (MICROZIDE) 12.5 MG capsule TAKE ONE CAPSULE BY MOUTH EVERY DAY Patient taking differently: Take 12.5 mg by mouth daily. TAKE ONE CAPSULE BY MOUTH EVERY DAY 12/06/13   Roderick Pee, MD  hydrochlorothiazide (MICROZIDE) 12.5 MG capsule TAKE 1 CAPSULE BY MOUTH EVERY DAY 02/14/14   Roderick Pee, MD  mometasone (NASONEX) 50 MCG/ACT nasal spray Place 2 sprays into the nose daily. 08/15/12   Gordy Savers, MD   BP 116/52 mmHg  Pulse 80  Temp(Src) 98.6 F (37 C) (Oral)  Resp 16  SpO2 99% Physical Exam  Constitutional: She appears well-developed and well-nourished.  HENT:  Head: Normocephalic and atraumatic.  Eyes: Conjunctivae and EOM are normal. Right eye exhibits no discharge. Left eye exhibits no discharge.  Cardiovascular: Normal rate and regular rhythm.   Pulmonary/Chest: Effort normal and breath sounds normal. No respiratory distress.  Abdominal: Soft. She exhibits no distension. There is no tenderness. There is no rebound.  Musculoskeletal: Normal range of motion. She exhibits no edema or tenderness.  Neurological: She is alert.  No altered mental status, able to give full  seemingly accurate history.  Face is symmetric, EOM's intact, pupils equal and reactive, vision intact, tongue and uvula midline without deviation Upper and Lower extremity motor 5/5, intact pain perception in distal extremities, 1+ symmeetric reflexes in biceps, patella tendons.  Finger to nose normal, heel to shin normal. Walks slowly without assistance or evident ataxia.  Skin: Skin is warm and dry.  Nursing note and vitals reviewed.   ED Course  Procedures (including critical care time) Labs Review Labs Reviewed  COMPREHENSIVE METABOLIC PANEL - Abnormal; Notable for the following:    Glucose, Bld 100 (*)    All other components within normal limits  ETHANOL  PROTIME-INR  APTT  CBC  DIFFERENTIAL  URINE RAPID DRUG SCREEN, HOSP PERFORMED  URINALYSIS, ROUTINE W REFLEX MICROSCOPIC (NOT AT [facility])  I-STAT CHEM 8, ED  I-STAT TROPOININ, ED    Imaging Review Ct Head Wo Contrast  02/09/2015   CLINICAL DATA:  Confusion.  EXAM: CT HEAD WITHOUT CONTRAST  TECHNIQUE: Contiguous axial images were obtained from the base of the skull through the vertex without intravenous contrast.  COMPARISON:  None.  FINDINGS: Ventricles are normal configuration. There is ventricular and sulcal enlargement reflecting mild generalized atrophy. There are no parenchymal masses or mass effect. There mild areas of patchy white matter hypoattenuation consistent with chronic microvascular ischemic change. There is no evidence of an infarct.  There are no extra-axial masses or abnormal fluid collections.  There is no intracranial hemorrhage.  Visualized sinuses and mastoid air cells are clear. No skull lesion.  IMPRESSION: 1. No acute intracranial abnormalities. 2. Atrophy and mild chronic microvascular ischemic change.   Electronically Signed   By: Amie Portland M.D.   On: 02/09/2015 14:20   I have personally reviewed and evaluated these images and lab results as part of my medical decision-making.   EKG Interpretation   Date/Time:  Sunday February 09 2015 13:21:08 EDT Ventricular Rate:  73 PR Interval:  131 QRS Duration: 124 QT Interval:  425 QTC Calculation: 468 R Axis:   -70 Text Interpretation:  Sinus rhythm Right bundle branch block Inferior  infarct, old Confirmed by [facility] MD, Barbara Cower  [phone]) on 02/09/2015 1:42:56 PM      MDM   Final diagnoses:  Confusion with non-focal neuro exam    Concern for possible TIA will workup accordingly and d/w neurology with results.  Spoke with neurology. Will get MRI to r/o stroke. Feels it is more likely seizure, would recommend starting keppra and following up as an outpatient.  Care transferred to Dr. Donnald Garre awaiting MRI results with likely d/c and neuro follow up on Keppra.    Marily Memos, MD 02/11/15 1625  Marily Memos, MD 02/11/15 [phone]

## 2015-02-09 NOTE — ED Notes (Signed)
A friend, [employer] [phone]), called EMS for confusion: Lyla picked her up for church. Normal, conversive self at church. After church [employer] was going to drive her home, but the patient walked home; however, she usually walks home from church every Sunday (1 mile). So Lyla drove to her house and she was seated inside and would only say "I'm ok" to every question. On EMS arrival she was oriented x1 and would only answer "I'm ok"... Would not answer birthday or any other questions. CBG 91. In route she began to come around and converse with EMS crew. Neighbor at bedside now and reports she seems at baseline. Oriented to self, place and situation, not time. Speaking in full sentences now. Reports she hasn't eaten today.

## 2017-10-05 ENCOUNTER — Emergency Department (HOSPITAL_COMMUNITY): Payer: Medicare Other

## 2017-10-05 ENCOUNTER — Other Ambulatory Visit: Payer: Self-pay

## 2017-10-05 ENCOUNTER — Emergency Department (HOSPITAL_COMMUNITY)
Admission: EM | Admit: 2017-10-05 | Discharge: 2017-10-05 | Disposition: A | Discharge status: Home / Self Care | Payer: Medicare Other | Attending: Emergency Medicine | Admitting: Emergency Medicine

## 2017-10-05 ENCOUNTER — Encounter (HOSPITAL_COMMUNITY): Payer: Self-pay | Admitting: Emergency Medicine

## 2017-10-05 DIAGNOSIS — I10 Essential (primary) hypertension: Secondary | ICD-10-CM | POA: Insufficient documentation

## 2017-10-05 DIAGNOSIS — S098XXA Other specified injuries of head, initial encounter: Secondary | ICD-10-CM | POA: Diagnosis not present

## 2017-10-05 DIAGNOSIS — Y92009 Unspecified place in unspecified non-institutional (private) residence as the place of occurrence of the external cause: Secondary | ICD-10-CM | POA: Insufficient documentation

## 2017-10-05 DIAGNOSIS — Z79899 Other long term (current) drug therapy: Secondary | ICD-10-CM | POA: Insufficient documentation

## 2017-10-05 DIAGNOSIS — R0781 Pleurodynia: Secondary | ICD-10-CM | POA: Diagnosis not present

## 2017-10-05 DIAGNOSIS — Y939 Activity, unspecified: Secondary | ICD-10-CM | POA: Diagnosis not present

## 2017-10-05 DIAGNOSIS — S299XXA Unspecified injury of thorax, initial encounter: Secondary | ICD-10-CM | POA: Diagnosis not present

## 2017-10-05 DIAGNOSIS — J45909 Unspecified asthma, uncomplicated: Secondary | ICD-10-CM | POA: Diagnosis not present

## 2017-10-05 DIAGNOSIS — Z7982 Long term (current) use of aspirin: Secondary | ICD-10-CM | POA: Insufficient documentation

## 2017-10-05 DIAGNOSIS — S0181XA Laceration without foreign body of other part of head, initial encounter: Secondary | ICD-10-CM | POA: Diagnosis not present

## 2017-10-05 DIAGNOSIS — F0390 Unspecified dementia without behavioral disturbance: Secondary | ICD-10-CM | POA: Diagnosis not present

## 2017-10-05 DIAGNOSIS — S0990XA Unspecified injury of head, initial encounter: Secondary | ICD-10-CM | POA: Diagnosis not present

## 2017-10-05 DIAGNOSIS — Y999 Unspecified external cause status: Secondary | ICD-10-CM | POA: Diagnosis not present

## 2017-10-05 DIAGNOSIS — S0101XA Laceration without foreign body of scalp, initial encounter: Secondary | ICD-10-CM | POA: Insufficient documentation

## 2017-10-05 DIAGNOSIS — W19XXXA Unspecified fall, initial encounter: Secondary | ICD-10-CM | POA: Diagnosis not present

## 2017-10-05 DIAGNOSIS — S0003XA Contusion of scalp, initial encounter: Secondary | ICD-10-CM | POA: Diagnosis not present

## 2017-10-05 DIAGNOSIS — S199XXA Unspecified injury of neck, initial encounter: Secondary | ICD-10-CM | POA: Diagnosis not present

## 2017-10-05 DIAGNOSIS — R0789 Other chest pain: Secondary | ICD-10-CM | POA: Diagnosis not present

## 2017-10-05 LAB — URINALYSIS, ROUTINE W REFLEX MICROSCOPIC
Bacteria, UA: NONE SEEN
Bilirubin Urine: NEGATIVE
GLUCOSE, UA: NEGATIVE mg/dL
KETONES UR: 20 mg/dL — AB
Leukocytes, UA: NEGATIVE
NITRITE: NEGATIVE
PH: 7 (ref 5.0–8.0)
Protein, ur: NEGATIVE mg/dL
Specific Gravity, Urine: 1.015 (ref 1.005–1.030)

## 2017-10-05 LAB — BASIC METABOLIC PANEL
Anion gap: 11 (ref 5–15)
BUN: 9 mg/dL (ref 6–20)
CALCIUM: 9.4 mg/dL (ref 8.9–10.3)
CO2: 25 mmol/L (ref 22–32)
CREATININE: 0.84 mg/dL (ref 0.44–1.00)
Chloride: 102 mmol/L (ref 101–111)
GFR calc Af Amer: >60 mL/min (ref >60)
GFR, EST NON AFRICAN AMERICAN: 56 mL/min — AB (ref >60)
GLUCOSE: 104 mg/dL — AB (ref 65–99)
Potassium: 4.2 mmol/L (ref 3.5–5.1)
Sodium: 138 mmol/L (ref 135–145)

## 2017-10-05 LAB — CBC
HCT: 46.1 % — ABNORMAL HIGH (ref 36.0–46.0)
Hemoglobin: 14.9 g/dL (ref 12.0–15.0)
MCH: 28.7 pg (ref 26.0–34.0)
MCHC: 32.3 g/dL (ref 30.0–36.0)
MCV: 88.7 fL (ref 78.0–100.0)
PLATELETS: 171 10*3/uL (ref 150–400)
RBC: 5.2 MIL/uL — ABNORMAL HIGH (ref 3.87–5.11)
RDW: 13.2 % (ref 11.5–15.5)
WBC: 8.5 10*3/uL (ref 4.0–10.5)

## 2017-10-05 LAB — CBG MONITORING, ED: Glucose-Capillary: 102 mg/dL — ABNORMAL HIGH (ref 65–99)

## 2017-10-05 MED ORDER — ACETAMINOPHEN 325 MG PO TABS
650.0000 mg | ORAL_TABLET | Freq: Once | ORAL | Status: AC
  Administered 2017-10-05: 650 mg via ORAL
  Filled 2017-10-05: qty 2

## 2017-10-05 NOTE — ED Notes (Signed)
Blood cleansed from face per NT - pt tolerated well; pt assisted to wheelchair per RN with caregiver at bedside who is responsible party that will transport pt home

## 2017-10-05 NOTE — ED Notes (Signed)
Care assumed at this time; resting quietly on stretcher - MD and NT in cleansing and assessing head wound; neighbors at bedside

## 2017-10-05 NOTE — ED Notes (Signed)
CSW in to speak with pt

## 2017-10-05 NOTE — Discharge Instructions (Addendum)
See your Physician for recheck.  Return if any problems.  °

## 2017-10-05 NOTE — Discharge Planning (Signed)
Carlisha Wisler J. Lucretia Roers, RN, BSN, [employer] [phone] Spoke with pt and POA/neighbor Kathlene November) at bedside regarding discharge planning for [facility]. Offered pt list of home health agencies to choose from.  Pt and Kathlene November chose [facility] to render services. Ayesha Rumpf, RN of [facility]  notified. Patient made aware that [facility]  will be in contact in 24-48 hours.  No DME needs identified at this time.

## 2017-10-05 NOTE — Progress Notes (Addendum)
9:08am- CSW and RNCM spoke with pt and neighbor Kathlene November) at bedside. Neighbor expressed that he has been living in the same neighborhood as pt for   years and he helps pt daily. Kathlene November reports that he takes pt breakfast and lunch daily as well as takes pt on outings with him. Kathlene November expresses that he DOES NOT really want pt placed as he expressed not doing that to pt however Kathlene November also expressed that he wants pt safe. At this time RNCM has set up [facility] services for pt. There are no further CSW needs. CSW will sign off.   CSW went to speak with pt at bedside. CSW attempted to gather collateral information from pt however pt was consumed with getting lines off of self. CSW tried multiple times to redirect pt back to conversation with CSW. At many efforts pt was able to express that pt doesn't feel that pt is needing help and then mentioned that pt would like to have an extra set of hands to help pt sometimes. CSW as well as NT asked pt about SNF placement and pt declined this offer at this time as pt expressed "I have a home honey". CSW to try and gather more information on pt's needs from neighbor once neighbor returns to the ED.   CSW will continue to follow for any further needs.   Claude Manges Laylani Pudwill, MSW, LCSW-A Emergency Department Clinical Social Worker [phone]

## 2017-10-05 NOTE — ED Provider Notes (Signed)
[facility] EMERGENCY DEPARTMENT Provider Note   CSN: [id number] Arrival date & time: 10/05/17  0509     History   Chief Complaint Chief Complaint  Patient presents with  . Fall  . Altered Mental Status    HPI Danielle Myers is a 84 y.o. female.  HPI  This is a 84 year old female with a history of hypertension and hyperlipidemia who presents after a fall.  Patient brought in by EMS.  History of dementia but lives alone.  She ambulated independently to her neighbor's house after falling.  Neighbors noted blood on her head.  Patient reports falling.  She states that she tripped and fell.  She does report some left chest wall pain.  She denies loss of consciousness.  She denies dizziness, shortness of breath.  She denies being on anticoagulants.  She denies any head or neck pain.  Denies nausea or vomiting.  Patient is able to provide history but is technically only oriented to herself.  She denies any hip or leg pain.  Past Medical History:  Diagnosis Date  . Arthritis     Patient Active Problem List   Diagnosis Date Noted  . Contact dermatitis due to poison ivy 09/08/2010  . CELLULITIS, HAND, LEFT 01/03/2008  . HYPERLIPIDEMIA 02/13/2007  . HYPERTENSION 02/13/2007  . ASTHMA 02/13/2007  . PNEUMONIA, HX OF 02/13/2007    History reviewed. No pertinent surgical history.   OB History   None      Home Medications    Prior to Admission medications   Medication Sig Start Date End Date Taking? Authorizing Provider  aspirin 81 MG EC tablet Take 81 mg by mouth daily.     [provider]  hydrochlorothiazide (MICROZIDE) 12.5 MG capsule TAKE ONE CAPSULE BY MOUTH EVERY DAY Patient taking differently: Take 12.5 mg by mouth daily. TAKE ONE CAPSULE BY MOUTH EVERY DAY 12/06/13   Roderick Pee, MD  hydrochlorothiazide (MICROZIDE) 12.5 MG capsule TAKE 1 CAPSULE BY MOUTH EVERY DAY 02/14/14   Roderick Pee, MD  mometasone (NASONEX) 50 MCG/ACT nasal spray  Place 2 sprays into the nose daily. 08/15/12   Gordy Savers, MD  Multiple Vitamin (MULTIVITAMIN) tablet Take 1 tablet by mouth daily.    [provider]    Family History History reviewed. No pertinent family history.  Social History Social History   Tobacco Use  . Smoking status: Never Smoker  . Smokeless tobacco: Never Used  Substance Use Topics  . Alcohol use: Yes  . Drug use: Not on file     Allergies   Patient has no known allergies.   Review of Systems Review of Systems  Constitutional: Negative for fever.  Respiratory: Negative for shortness of breath.   Cardiovascular: Positive for chest pain.  Gastrointestinal: Negative for abdominal pain, nausea and vomiting.  Musculoskeletal: Negative for neck pain.  Neurological: Negative for syncope and headaches.  All other systems reviewed and are negative.    Physical Exam Updated Vital Signs BP 105/90   Pulse 83   Temp 97.8 F (36.6 C) (Oral)   Resp 18   Ht  (1.575 m)   Wt 59 kg (130 lb)   SpO2 95%   BMI 23.78 kg/m   Physical Exam  Constitutional:  Elderly, nontoxic-appearing, no acute distress  HENT:  Head: Normocephalic.  Mouth/Throat: Oropharynx is clear and moist.  Amount of blood noted over the right side of the head, tenderness palpation over the right posterior occiput  Eyes:  Pupils are equal, round, and reactive to light. EOM are normal.  Neck:  Cervical collar in place, no midline C-spine tenderness to palpation  Cardiovascular: Normal rate, regular rhythm and normal heart sounds.  Pulmonary/Chest: Effort normal and breath sounds normal. No respiratory distress. She has no wheezes. She exhibits tenderness.  No crepitus or overlying skin changes  Abdominal: Soft. Bowel sounds are normal. There is no tenderness. There is no guarding.  Musculoskeletal:  Normal range of motion bilateral hips and knees, no deformities noted  Neurological: She is alert.  Oriented x1, cranial  nerves II through XII intact, 5 out of 5 strength in all 4 extremities  Skin: Skin is warm and dry.  Psychiatric: She has a normal mood and affect.  Nursing note and vitals reviewed.    ED Treatments / Results  Labs (all labs ordered are listed, but only abnormal results are displayed) Labs Reviewed  BASIC METABOLIC PANEL - Abnormal; Notable for the following components:      Result Value   Glucose, Bld 104 (*)    GFR calc non Af Amer 56 (*)    All other components within normal limits  CBC - Abnormal; Notable for the following components:   RBC 5.20 (*)    HCT 46.1 (*)    All other components within normal limits  CBG MONITORING, ED - Abnormal; Notable for the following components:   Glucose-Capillary 102 (*)    All other components within normal limits  URINALYSIS, ROUTINE W REFLEX MICROSCOPIC    EKG EKG Interpretation  Date/Time:  Wednesday Oct 05 2017 05:32:57 EDT Ventricular Rate:  87 PR Interval:    QRS Duration: 117 QT Interval:  406 QTC Calculation: 489 R Axis:   -69 Text Interpretation:  Sinus rhythm Incomplete right bundle branch block Anterolateral infarct, old Similar to prior Confirmed by Ross Marcus ([id number]) on 10/05/2017 6:11:08 AM Also confirmed by Ross Marcus ([id number]), editor Elita Quick [phone])  on 10/05/2017 6:53:01 AM   Radiology Dg Chest 2 View  Result Date: 10/05/2017 CLINICAL DATA:  84 y/o  F; fall with left-sided rib pain. EXAM: CHEST - 2 VIEW COMPARISON:  02/09/2015 chest radiograph. FINDINGS: Stable normal cardiac silhouette given projection and technique. Uncoiled aorta. Aortic atherosclerosis with calcification. Stable mildly elevated right hemidiaphragm. Chronic interstitial pulmonary edema is stable. No consolidation, effusion, or pneumothorax. No acute fracture identified. IMPRESSION: No acute fracture identified. If clinically indicated, consider dedicated rib radiographs. Stable chronic interstitial pulmonary edema and aortic  atherosclerosis. Electronically Signed   By: Mitzi Hansen M.D.   On: 10/05/2017 06:25   Ct Head Wo Contrast  Result Date: 10/05/2017 CLINICAL DATA:  84 y/o F; unwitnessed fall with injury to the right side of the head. EXAM: CT HEAD WITHOUT CONTRAST CT CERVICAL SPINE WITHOUT CONTRAST TECHNIQUE: Multidetector CT imaging of the head and cervical spine was performed following the standard protocol without intravenous contrast. Multiplanar CT image reconstructions of the cervical spine were also generated. COMPARISON:  02/09/2015 CT and MRI of the head. FINDINGS: CT HEAD FINDINGS Brain: No evidence of acute infarction, hemorrhage, hydrocephalus, extra-axial collection or mass lesion/mass effect. Interval mild progression of chronic microvascular ischemic changes and parenchymal volume loss of the brain. Vascular: Calcific atherosclerosis of carotid siphons. No hyperdense vessel identified. Skull: Right parietal region scalp contusion. No calvarial fracture. Sinuses/Orbits: No acute finding. Partial opacification of the external auditory canals, likely cerumen. Other: None. CT CERVICAL SPINE FINDINGS Alignment: C6-7 and C7-T1 grade 1 anterolisthesis. Skull base and vertebrae: No  acute fracture. No primary bone lesion or focal pathologic process. Soft tissues and spinal canal: No prevertebral fluid or swelling. No visible canal hematoma. Disc levels: Cervical spondylosis with moderate to severe discogenic degenerative changes from C4 through C7 and left-greater-than-right facet hypertrophy. Uncovertebral and facet hypertrophy bilaterally results in bony foraminal encroachment from C3-C5. No high-grade bony canal stenosis. Upper chest: Interlobular septal thickening and patchy ground-glass opacification in the lung apices compatible with mild interstitial pulmonary edema. Other: Multinodular thyroid goiter. IMPRESSION: 1. Right parietal region scalp contusion. 2. No acute intracranial abnormality or  calvarial fracture identified. 3. No acute fracture or dislocation of the cervical spine. 4. Mild progression of chronic microvascular ischemic changes and parenchymal volume loss of the brain from 2016. 5. Cervical spondylosis with multilevel disc and facet degenerative changes. No high-grade bony canal stenosis. 6. Mild interstitial pulmonary edema. 7. Multinodular thyroid goiter. Electronically Signed   By: Mitzi Hansen M.D.   On: 10/05/2017 06:54   Ct Cervical Spine Wo Contrast  Result Date: 10/05/2017 CLINICAL DATA:  84 y/o F; unwitnessed fall with injury to the right side of the head. EXAM: CT HEAD WITHOUT CONTRAST CT CERVICAL SPINE WITHOUT CONTRAST TECHNIQUE: Multidetector CT imaging of the head and cervical spine was performed following the standard protocol without intravenous contrast. Multiplanar CT image reconstructions of the cervical spine were also generated. COMPARISON:  02/09/2015 CT and MRI of the head. FINDINGS: CT HEAD FINDINGS Brain: No evidence of acute infarction, hemorrhage, hydrocephalus, extra-axial collection or mass lesion/mass effect. Interval mild progression of chronic microvascular ischemic changes and parenchymal volume loss of the brain. Vascular: Calcific atherosclerosis of carotid siphons. No hyperdense vessel identified. Skull: Right parietal region scalp contusion. No calvarial fracture. Sinuses/Orbits: No acute finding. Partial opacification of the external auditory canals, likely cerumen. Other: None. CT CERVICAL SPINE FINDINGS Alignment: C6-7 and C7-T1 grade 1 anterolisthesis. Skull base and vertebrae: No acute fracture. No primary bone lesion or focal pathologic process. Soft tissues and spinal canal: No prevertebral fluid or swelling. No visible canal hematoma. Disc levels: Cervical spondylosis with moderate to severe discogenic degenerative changes from C4 through C7 and left-greater-than-right facet hypertrophy. Uncovertebral and facet hypertrophy  bilaterally results in bony foraminal encroachment from C3-C5. No high-grade bony canal stenosis. Upper chest: Interlobular septal thickening and patchy ground-glass opacification in the lung apices compatible with mild interstitial pulmonary edema. Other: Multinodular thyroid goiter. IMPRESSION: 1. Right parietal region scalp contusion. 2. No acute intracranial abnormality or calvarial fracture identified. 3. No acute fracture or dislocation of the cervical spine. 4. Mild progression of chronic microvascular ischemic changes and parenchymal volume loss of the brain from 2016. 5. Cervical spondylosis with multilevel disc and facet degenerative changes. No high-grade bony canal stenosis. 6. Mild interstitial pulmonary edema. 7. Multinodular thyroid goiter. Electronically Signed   By: Mitzi Hansen M.D.   On: 10/05/2017 06:54    Procedures Procedures (including critical care time)  Medications Ordered in ED Medications - No data to display   Initial Impression / Assessment and Plan / ED Course  I have reviewed the triage vital signs and the nursing notes.  Pertinent labs & imaging results that were available during my care of the patient were reviewed by me and considered in my medical decision making (see chart for details).     Patient presents after fall.  At baseline is oriented x1.  She is very pleasant and able to provide history otherwise.  Vital signs reassuring.  Notable head injury.  Unable to visualize  injury because of excessive blood.  Will clean and reevaluate.  CT head neck obtained.  Basic lab work obtained and largely reassuring.  EKG shows no evidence of arrhythmia.  Chest x-ray without any evidence of pneumothorax or obvious rib fracture.  CT head only showed parietal scalp injury.  Cervical spine negative.  Urinalysis pending.  Patient signed out pending ongoing work-up.  Patient's head injury requiring aggressive cleaning in order to visualize.  PA to assess and clean.   See her note for additional details.  Given patient lives alone and has dementia, she likely needs social worker case management to evaluate to make sure she is safe at home.  Final Clinical Impressions(s) / ED Diagnoses   Final diagnoses:  None    ED Discharge Orders    None       Horton, Mayer Masker, MD 10/05/17 [phone]

## 2017-10-05 NOTE — ED Triage Notes (Signed)
Pt BIB EMS from home. EMS called by neighbors whose house the pt came up to with injury to the posterior and R side of her head. Suspect unwitnessed fall. Pt has hx of dementia and neighbors state that it seems to be getting worse. Pt lives alone and had wandered over to the neighbors' house earlier in the evening, without present injuries. Pt lives alone. VSS.

## 2017-10-05 NOTE — ED Notes (Signed)
This tech attempted to clean Pt's head with difficulty, as Pt repeatedly refused care, stating "I will go home and wash up myself". This tech explained to Pt several times that the MD needs to see the location of the laceration, but Pt did not retain this information. RN gave permission for this tech to remove leads, PulseOx, and BP cuff as Pt kept pulling them off. Additionally, CSW experienced difficulty asking the Pt if she felt she needed assistance at home. MD made aware.

## 2019-09-25 ENCOUNTER — Telehealth: Payer: Self-pay | Admitting: Adult Health

## 2019-09-25 NOTE — Telephone Encounter (Signed)
Attempted to call patient to review criteria for homebound covid 19 vaccination.  Patient did not answer.  Unable to leave message.  Lillard Anes, NP

## 2019-10-02 ENCOUNTER — Telehealth: Payer: Self-pay | Admitting: Physician Assistant

## 2019-10-02 NOTE — Telephone Encounter (Signed)
Attempted to call patient about homebound COVID19 vaccination program.  No answer.  Unable to leave voice mail.  Manson Passey, [place]

## 2019-10-05 ENCOUNTER — Emergency Department (HOSPITAL_COMMUNITY): Payer: Medicare Other

## 2019-10-05 ENCOUNTER — Other Ambulatory Visit: Payer: Self-pay

## 2019-10-05 ENCOUNTER — Inpatient Hospital Stay (HOSPITAL_COMMUNITY)
Admission: EM | Admit: 2019-10-05 | Discharge: 2019-10-10 | DRG: 871 | Disposition: A | Discharge status: Skilled Nursing Facility | Payer: Medicare Other | Attending: Internal Medicine | Admitting: Internal Medicine

## 2019-10-05 ENCOUNTER — Encounter (HOSPITAL_COMMUNITY): Payer: Self-pay | Admitting: Emergency Medicine

## 2019-10-05 DIAGNOSIS — F0390 Unspecified dementia without behavioral disturbance: Secondary | ICD-10-CM

## 2019-10-05 DIAGNOSIS — Z66 Do not resuscitate: Secondary | ICD-10-CM | POA: Diagnosis present

## 2019-10-05 DIAGNOSIS — E785 Hyperlipidemia, unspecified: Secondary | ICD-10-CM | POA: Diagnosis not present

## 2019-10-05 DIAGNOSIS — K8020 Calculus of gallbladder without cholecystitis without obstruction: Secondary | ICD-10-CM | POA: Diagnosis not present

## 2019-10-05 DIAGNOSIS — Z20822 Contact with and (suspected) exposure to covid-19: Secondary | ICD-10-CM | POA: Diagnosis not present

## 2019-10-05 DIAGNOSIS — D6959 Other secondary thrombocytopenia: Secondary | ICD-10-CM | POA: Diagnosis present

## 2019-10-05 DIAGNOSIS — R6520 Severe sepsis without septic shock: Secondary | ICD-10-CM | POA: Diagnosis not present

## 2019-10-05 DIAGNOSIS — R531 Weakness: Secondary | ICD-10-CM | POA: Diagnosis not present

## 2019-10-05 DIAGNOSIS — G9341 Metabolic encephalopathy: Secondary | ICD-10-CM | POA: Diagnosis not present

## 2019-10-05 DIAGNOSIS — E46 Unspecified protein-calorie malnutrition: Secondary | ICD-10-CM | POA: Diagnosis not present

## 2019-10-05 DIAGNOSIS — R627 Adult failure to thrive: Secondary | ICD-10-CM | POA: Diagnosis present

## 2019-10-05 DIAGNOSIS — I4519 Other right bundle-branch block: Secondary | ICD-10-CM | POA: Diagnosis present

## 2019-10-05 DIAGNOSIS — N390 Urinary tract infection, site not specified: Secondary | ICD-10-CM | POA: Diagnosis present

## 2019-10-05 DIAGNOSIS — D696 Thrombocytopenia, unspecified: Secondary | ICD-10-CM | POA: Diagnosis not present

## 2019-10-05 DIAGNOSIS — R1312 Dysphagia, oropharyngeal phase: Secondary | ICD-10-CM | POA: Diagnosis not present

## 2019-10-05 DIAGNOSIS — K7689 Other specified diseases of liver: Secondary | ICD-10-CM | POA: Diagnosis not present

## 2019-10-05 DIAGNOSIS — R52 Pain, unspecified: Secondary | ICD-10-CM | POA: Diagnosis not present

## 2019-10-05 DIAGNOSIS — R945 Abnormal results of liver function studies: Secondary | ICD-10-CM | POA: Diagnosis present

## 2019-10-05 DIAGNOSIS — H9190 Unspecified hearing loss, unspecified ear: Secondary | ICD-10-CM | POA: Diagnosis present

## 2019-10-05 DIAGNOSIS — J45909 Unspecified asthma, uncomplicated: Secondary | ICD-10-CM | POA: Diagnosis present

## 2019-10-05 DIAGNOSIS — R41841 Cognitive communication deficit: Secondary | ICD-10-CM | POA: Diagnosis not present

## 2019-10-05 DIAGNOSIS — R109 Unspecified abdominal pain: Secondary | ICD-10-CM

## 2019-10-05 DIAGNOSIS — R0902 Hypoxemia: Secondary | ICD-10-CM | POA: Diagnosis not present

## 2019-10-05 DIAGNOSIS — Z6826 Body mass index (BMI) 26.0-26.9, adult: Secondary | ICD-10-CM | POA: Diagnosis not present

## 2019-10-05 DIAGNOSIS — K8309 Other cholangitis: Secondary | ICD-10-CM

## 2019-10-05 DIAGNOSIS — J9811 Atelectasis: Secondary | ICD-10-CM | POA: Diagnosis not present

## 2019-10-05 DIAGNOSIS — R410 Disorientation, unspecified: Secondary | ICD-10-CM | POA: Diagnosis not present

## 2019-10-05 DIAGNOSIS — F0150 Vascular dementia without behavioral disturbance: Secondary | ICD-10-CM | POA: Diagnosis not present

## 2019-10-05 DIAGNOSIS — E872 Acidosis: Secondary | ICD-10-CM | POA: Diagnosis not present

## 2019-10-05 DIAGNOSIS — K8001 Calculus of gallbladder with acute cholecystitis with obstruction: Secondary | ICD-10-CM | POA: Diagnosis present

## 2019-10-05 DIAGNOSIS — Z6825 Body mass index (BMI) 25.0-25.9, adult: Secondary | ICD-10-CM

## 2019-10-05 DIAGNOSIS — K810 Acute cholecystitis: Secondary | ICD-10-CM | POA: Diagnosis not present

## 2019-10-05 DIAGNOSIS — Z7982 Long term (current) use of aspirin: Secondary | ICD-10-CM | POA: Diagnosis not present

## 2019-10-05 DIAGNOSIS — I10 Essential (primary) hypertension: Secondary | ICD-10-CM | POA: Diagnosis present

## 2019-10-05 DIAGNOSIS — Z7401 Bed confinement status: Secondary | ICD-10-CM | POA: Diagnosis not present

## 2019-10-05 DIAGNOSIS — N83202 Unspecified ovarian cyst, left side: Secondary | ICD-10-CM | POA: Diagnosis present

## 2019-10-05 DIAGNOSIS — Z515 Encounter for palliative care: Secondary | ICD-10-CM | POA: Diagnosis not present

## 2019-10-05 DIAGNOSIS — E876 Hypokalemia: Secondary | ICD-10-CM | POA: Diagnosis present

## 2019-10-05 DIAGNOSIS — K8000 Calculus of gallbladder with acute cholecystitis without obstruction: Secondary | ICD-10-CM | POA: Diagnosis present

## 2019-10-05 DIAGNOSIS — G301 Alzheimer's disease with late onset: Secondary | ICD-10-CM | POA: Diagnosis not present

## 2019-10-05 DIAGNOSIS — A419 Sepsis, unspecified organism: Secondary | ICD-10-CM | POA: Diagnosis not present

## 2019-10-05 DIAGNOSIS — Z7189 Other specified counseling: Secondary | ICD-10-CM | POA: Diagnosis not present

## 2019-10-05 DIAGNOSIS — F0280 Dementia in other diseases classified elsewhere without behavioral disturbance: Secondary | ICD-10-CM | POA: Diagnosis not present

## 2019-10-05 DIAGNOSIS — N838 Other noninflammatory disorders of ovary, fallopian tube and broad ligament: Secondary | ICD-10-CM | POA: Diagnosis not present

## 2019-10-05 DIAGNOSIS — K5730 Diverticulosis of large intestine without perforation or abscess without bleeding: Secondary | ICD-10-CM | POA: Diagnosis not present

## 2019-10-05 DIAGNOSIS — M1990 Unspecified osteoarthritis, unspecified site: Secondary | ICD-10-CM | POA: Diagnosis present

## 2019-10-05 DIAGNOSIS — M6281 Muscle weakness (generalized): Secondary | ICD-10-CM | POA: Diagnosis not present

## 2019-10-05 DIAGNOSIS — R404 Transient alteration of awareness: Secondary | ICD-10-CM | POA: Diagnosis not present

## 2019-10-05 DIAGNOSIS — K828 Other specified diseases of gallbladder: Secondary | ICD-10-CM | POA: Diagnosis not present

## 2019-10-05 DIAGNOSIS — M2550 Pain in unspecified joint: Secondary | ICD-10-CM | POA: Diagnosis not present

## 2019-10-05 DIAGNOSIS — R7989 Other specified abnormal findings of blood chemistry: Secondary | ICD-10-CM | POA: Diagnosis not present

## 2019-10-05 LAB — URINALYSIS, ROUTINE W REFLEX MICROSCOPIC
Glucose, UA: NEGATIVE mg/dL
Hgb urine dipstick: NEGATIVE
Ketones, ur: 5 mg/dL — AB
Nitrite: NEGATIVE
Protein, ur: 100 mg/dL — AB
Specific Gravity, Urine: >1.046 — ABNORMAL HIGH (ref 1.005–1.030)
WBC, UA: >50 WBC/hpf — ABNORMAL HIGH (ref 0–5)
pH: 6 (ref 5.0–8.0)

## 2019-10-05 LAB — COMPREHENSIVE METABOLIC PANEL
ALT: 154 U/L — ABNORMAL HIGH (ref 0–44)
AST: 207 U/L — ABNORMAL HIGH (ref 15–41)
Albumin: 3.2 g/dL — ABNORMAL LOW (ref 3.5–5.0)
Alkaline Phosphatase: 196 U/L — ABNORMAL HIGH (ref 38–126)
Anion gap: 13 (ref 5–15)
BUN: 12 mg/dL (ref 8–23)
CO2: 21 mmol/L — ABNORMAL LOW (ref 22–32)
Calcium: 9.1 mg/dL (ref 8.9–10.3)
Chloride: 110 mmol/L (ref 98–111)
Creatinine, Ser: 0.8 mg/dL (ref 0.44–1.00)
GFR calc Af Amer: >60 mL/min (ref >60)
GFR calc non Af Amer: >60 mL/min (ref >60)
Glucose, Bld: 139 mg/dL — ABNORMAL HIGH (ref 70–99)
Potassium: 2.8 mmol/L — ABNORMAL LOW (ref 3.5–5.1)
Sodium: 144 mmol/L (ref 135–145)
Total Bilirubin: 4.9 mg/dL — ABNORMAL HIGH (ref 0.3–1.2)
Total Protein: 6.6 g/dL (ref 6.5–8.1)

## 2019-10-05 LAB — PROTIME-INR
INR: 1.4 — ABNORMAL HIGH (ref 0.8–1.2)
Prothrombin Time: 16.6 seconds — ABNORMAL HIGH (ref 11.4–15.2)

## 2019-10-05 LAB — CBC
HCT: 39.3 % (ref 36.0–46.0)
Hemoglobin: 12.4 g/dL (ref 12.0–15.0)
MCH: 29.7 pg (ref 26.0–34.0)
MCHC: 31.6 g/dL (ref 30.0–36.0)
MCV: 94 fL (ref 80.0–100.0)
Platelets: 124 10*3/uL — ABNORMAL LOW (ref 150–400)
RBC: 4.18 MIL/uL (ref 3.87–5.11)
RDW: 14.2 % (ref 11.5–15.5)
WBC: 22.6 10*3/uL — ABNORMAL HIGH (ref 4.0–10.5)
nRBC: 0 % (ref 0.0–0.2)

## 2019-10-05 LAB — CREATININE, SERUM
Creatinine, Ser: 0.59 mg/dL (ref 0.44–1.00)
GFR calc Af Amer: >60 mL/min (ref >60)
GFR calc non Af Amer: >60 mL/min (ref >60)

## 2019-10-05 LAB — CBC WITH DIFFERENTIAL/PLATELET
Abs Immature Granulocytes: 0.2 10*3/uL — ABNORMAL HIGH (ref 0.00–0.07)
Basophils Absolute: 0.1 10*3/uL (ref 0.0–0.1)
Basophils Relative: 0 %
Eosinophils Absolute: 0 10*3/uL (ref 0.0–0.5)
Eosinophils Relative: 0 %
HCT: 43.1 % (ref 36.0–46.0)
Hemoglobin: 14.1 g/dL (ref 12.0–15.0)
Immature Granulocytes: 1 %
Lymphocytes Relative: 1 %
Lymphs Abs: 0.3 10*3/uL — ABNORMAL LOW (ref 0.7–4.0)
MCH: 29.4 pg (ref 26.0–34.0)
MCHC: 32.7 g/dL (ref 30.0–36.0)
MCV: 90 fL (ref 80.0–100.0)
Monocytes Absolute: 1.2 10*3/uL — ABNORMAL HIGH (ref 0.1–1.0)
Monocytes Relative: 5 %
Neutro Abs: 20.7 10*3/uL — ABNORMAL HIGH (ref 1.7–7.7)
Neutrophils Relative %: 93 %
Platelets: 140 10*3/uL — ABNORMAL LOW (ref 150–400)
RBC: 4.79 MIL/uL (ref 3.87–5.11)
RDW: 14 % (ref 11.5–15.5)
WBC: 22.5 10*3/uL — ABNORMAL HIGH (ref 4.0–10.5)
nRBC: 0 % (ref 0.0–0.2)

## 2019-10-05 LAB — TSH: TSH: 1.372 u[IU]/mL (ref 0.350–4.500)

## 2019-10-05 LAB — LACTIC ACID, PLASMA
Lactic Acid, Venous: 4 mmol/L (ref 0.5–1.9)
Lactic Acid, Venous: 3.5 mmol/L (ref 0.5–1.9)
Lactic Acid, Venous: 2.7 mmol/L (ref 0.5–1.9)
Lactic Acid, Venous: 2.5 mmol/L (ref 0.5–1.9)

## 2019-10-05 LAB — SARS CORONAVIRUS 2 BY RT PCR (HOSPITAL ORDER, PERFORMED IN ~~LOC~~ HOSPITAL LAB): SARS Coronavirus 2: NEGATIVE

## 2019-10-05 LAB — APTT: aPTT: 29 seconds (ref 24–36)

## 2019-10-05 LAB — LIPASE, BLOOD: Lipase: 22 U/L (ref 11–51)

## 2019-10-05 LAB — BRAIN NATRIURETIC PEPTIDE: B Natriuretic Peptide: 260.6 pg/mL — ABNORMAL HIGH (ref 0.0–100.0)

## 2019-10-05 MED ORDER — SODIUM CHLORIDE 0.9 % IV BOLUS
1000.0000 mL | Freq: Once | INTRAVENOUS | Status: AC
  Administered 2019-10-05: 1000 mL via INTRAVENOUS

## 2019-10-05 MED ORDER — SODIUM CHLORIDE 0.9 % IV SOLN
2.0000 g | Freq: Once | INTRAVENOUS | Status: AC
  Administered 2019-10-05: 2 g via INTRAVENOUS
  Filled 2019-10-05: qty 2

## 2019-10-05 MED ORDER — HEPARIN SODIUM (PORCINE) 5000 UNIT/ML IJ SOLN
5000.0000 [IU] | Freq: Three times a day (TID) | INTRAMUSCULAR | Status: DC
  Administered 2019-10-05 – 2019-10-06 (×3): 5000 [IU] via SUBCUTANEOUS
  Filled 2019-10-05 (×3): qty 1

## 2019-10-05 MED ORDER — VANCOMYCIN HCL 750 MG/150ML IV SOLN
750.0000 mg | INTRAVENOUS | Status: DC
  Administered 2019-10-06: 750 mg via INTRAVENOUS
  Filled 2019-10-05: qty 150

## 2019-10-05 MED ORDER — ONDANSETRON HCL 4 MG PO TABS: 4.0000 mg | ORAL_TABLET | Freq: Four times a day (QID) | ORAL | Status: DC | PRN

## 2019-10-05 MED ORDER — VANCOMYCIN HCL IN DEXTROSE 1-5 GM/200ML-% IV SOLN
1000.0000 mg | Freq: Once | INTRAVENOUS | Status: DC
  Filled 2019-10-05: qty 200

## 2019-10-05 MED ORDER — SODIUM CHLORIDE 0.9 % IV SOLN: 2.0000 g | Freq: Once | INTRAVENOUS | Status: DC

## 2019-10-05 MED ORDER — VANCOMYCIN HCL 1250 MG/250ML IV SOLN
1250.0000 mg | Freq: Once | INTRAVENOUS | Status: AC
  Administered 2019-10-05: 1250 mg via INTRAVENOUS
  Filled 2019-10-05: qty 250

## 2019-10-05 MED ORDER — SODIUM CHLORIDE 0.9 % IV SOLN
2.0000 g | Freq: Two times a day (BID) | INTRAVENOUS | Status: DC
  Administered 2019-10-06 – 2019-10-10 (×9): 2 g via INTRAVENOUS
  Filled 2019-10-05 (×11): qty 2

## 2019-10-05 MED ORDER — POTASSIUM CHLORIDE 10 MEQ/100ML IV SOLN
10.0000 meq | INTRAVENOUS | Status: AC
  Administered 2019-10-05 (×4): 10 meq via INTRAVENOUS
  Filled 2019-10-05 (×4): qty 100

## 2019-10-05 MED ORDER — METRONIDAZOLE IN NACL 5-0.79 MG/ML-% IV SOLN
500.0000 mg | Freq: Three times a day (TID) | INTRAVENOUS | Status: DC
  Administered 2019-10-05 – 2019-10-10 (×14): 500 mg via INTRAVENOUS
  Filled 2019-10-05 (×14): qty 100

## 2019-10-05 MED ORDER — SODIUM CHLORIDE (PF) 0.9 % IJ SOLN
INTRAMUSCULAR | Status: AC
  Filled 2019-10-05: qty 50

## 2019-10-05 MED ORDER — SODIUM CHLORIDE 0.9 % IV SOLN: INTRAVENOUS | Status: DC

## 2019-10-05 MED ORDER — SODIUM CHLORIDE 0.9 % IV BOLUS (SEPSIS)
1000.0000 mL | Freq: Once | INTRAVENOUS | Status: AC
  Administered 2019-10-05: 1000 mL via INTRAVENOUS

## 2019-10-05 MED ORDER — ALBUTEROL SULFATE (2.5 MG/3ML) 0.083% IN NEBU: 2.5000 mg | INHALATION_SOLUTION | RESPIRATORY_TRACT | Status: DC | PRN

## 2019-10-05 MED ORDER — ACETAMINOPHEN 325 MG PO TABS: 650.0000 mg | ORAL_TABLET | Freq: Four times a day (QID) | ORAL | Status: DC | PRN

## 2019-10-05 MED ORDER — ONDANSETRON HCL 4 MG/2ML IJ SOLN: 4.0000 mg | Freq: Four times a day (QID) | INTRAMUSCULAR | Status: DC | PRN

## 2019-10-05 MED ORDER — MORPHINE SULFATE (PF) 2 MG/ML IV SOLN
2.0000 mg | INTRAVENOUS | Status: DC | PRN
  Administered 2019-10-06 – 2019-10-07 (×2): 2 mg via INTRAVENOUS
  Filled 2019-10-05 (×3): qty 1

## 2019-10-05 MED ORDER — METRONIDAZOLE IN NACL 5-0.79 MG/ML-% IV SOLN
500.0000 mg | Freq: Once | INTRAVENOUS | Status: AC
  Administered 2019-10-05: 500 mg via INTRAVENOUS
  Filled 2019-10-05: qty 100

## 2019-10-05 MED ORDER — SENNOSIDES-DOCUSATE SODIUM 8.6-50 MG PO TABS: 1.0000 | ORAL_TABLET | Freq: Every evening | ORAL | Status: DC | PRN

## 2019-10-05 MED ORDER — IOHEXOL 300 MG/ML  SOLN
75.0000 mL | Freq: Once | INTRAMUSCULAR | Status: AC | PRN
  Administered 2019-10-05: 75 mL via INTRAVENOUS

## 2019-10-05 MED ORDER — ACETAMINOPHEN 650 MG RE SUPP: 650.0000 mg | Freq: Four times a day (QID) | RECTAL | Status: DC | PRN

## 2019-10-05 MED ORDER — VANCOMYCIN HCL IN DEXTROSE 1-5 GM/200ML-% IV SOLN: 1000.0000 mg | Freq: Once | INTRAVENOUS | Status: DC

## 2019-10-05 NOTE — Consult Note (Signed)
Reason for Consult:cholecystitis Referring Physician: Lockie Mola, DO  Danielle Myers is an 86 y.o. female.  HPI:  Patient is a 86 year old female who came to the emergency department with weakness, decreased mental status, emesis, and loose stools.  She has reportedly had some upper respiratory symptoms for a few days.  She has a history of asthma and mild dementia.  She lives along with the assistance of 2 caregivers.  Her power of attorney is her neighbor.  Her work-up in the emergency department showed elevated liver function tests with a bilirubin of 4.9 and hypokalemia with a potassium of 2.8.  Her white blood cell count was 22.5.  Lipase was normal.  Lactate was elevated.  Because of the vomiting and history of abdominal pain, she had a CT scan.  This showed a left ovarian cyst as well as a slightly distended gallbladder with possibly a small amount of pericholecystic fluid.  Right upper quadrant ultrasound was then performed showing a distended gallbladder without gallstones, prominent gallbladder wall thickening, and mild pericholecystic fluid.  Sonographic Eulah Pont sign is negative.  Past Medical History:  Diagnosis Date  . Arthritis     History reviewed. No pertinent surgical history.  History reviewed. No pertinent family history.  Social History:  reports that she has never smoked. She has never used smokeless tobacco. She reports current alcohol use. No history on file for drug.  Allergies: No Known Allergies  Medications:  aspirin 81 MG EC tablet hydrochlorothiazide (MICROZIDE) 12.5 MG capsule hydrochlorothiazide (MICROZIDE) 12.5 MG capsule mometasone (NASONEX) 50 MCG/ACT nasal spray Multiple Vitamin (MULTIVITAMIN) tablet  Results for orders placed or performed during the hospital encounter of 10/05/19 (from the past 48 hour(s))  CBC with Differential/Platelet     Status: Abnormal   Collection Time: 10/05/19 11:23 AM  Result Value Ref Range   WBC 22.5 (H) 4.0 - 10.5 K/uL    RBC 4.79 3.87 - 5.11 MIL/uL   Hemoglobin 14.1 12.0 - 15.0 g/dL   HCT 04.5 40.9 - 81.1 %   MCV 90.0 80.0 - 100.0 fL   MCH 29.4 26.0 - 34.0 pg   MCHC 32.7 30.0 - 36.0 g/dL   RDW 91.4 78.2 - 95.6 %   Platelets 140 (L) 150 - 400 K/uL   nRBC 0.0 0.0 - 0.2 %   Neutrophils Relative % 93 %   Neutro Abs 20.7 (H) 1.7 - 7.7 K/uL   Lymphocytes Relative 1 %   Lymphs Abs 0.3 (L) 0.7 - 4.0 K/uL   Monocytes Relative 5 %   Monocytes Absolute 1.2 (H) 0.1 - 1.0 K/uL   Eosinophils Relative 0 %   Eosinophils Absolute 0.0 0.0 - 0.5 K/uL   Basophils Relative 0 %   Basophils Absolute 0.1 0.0 - 0.1 K/uL   Immature Granulocytes 1 %   Abs Immature Granulocytes 0.20 (H) 0.00 - 0.07 K/uL    Comment: Performed at [facility], [address], [place], [place] [address]  Comprehensive metabolic panel     Status: Abnormal   Collection Time: 10/05/19 11:23 AM  Result Value Ref Range   Sodium 144 135 - 145 mmol/L   Potassium 2.8 (L) 3.5 - 5.1 mmol/L   Chloride 110 98 - 111 mmol/L   CO2 21 (L) 22 - 32 mmol/L   Glucose, Bld 139 (H) 70 - 99 mg/dL    Comment: Glucose reference range applies only to samples taken after fasting for at least 8 hours.   BUN 12 8 - 23 mg/dL  Creatinine, Ser 0.80 0.44 - 1.00 mg/dL   Calcium 9.1 8.9 - 16.1 mg/dL   Total Protein 6.6 6.5 - 8.1 g/dL   Albumin 3.2 (L) 3.5 - 5.0 g/dL   AST 096 (H) 15 - 41 U/L   ALT 154 (H) 0 - 44 U/L   Alkaline Phosphatase 196 (H) 38 - 126 U/L   Total Bilirubin 4.9 (H) 0.3 - 1.2 mg/dL   GFR calc non Af Amer >60 >60 mL/min   GFR calc Af Amer >60 >60 mL/min   Anion gap 13 5 - 15    Comment: Performed at [facility], [address] [place], [place] [address]  Brain natriuretic peptide     Status: Abnormal   Collection Time: 10/05/19 11:23 AM  Result Value Ref Range   B Natriuretic Peptide 260.6 (H) 0.0 - 100.0 pg/mL    Comment: Performed at [facility], [address] [place], [place]  [address]  Urinalysis, Routine w reflex microscopic     Status: Abnormal   Collection Time: 10/05/19 11:23 AM  Result Value Ref Range   Color, Urine AMBER (A) YELLOW    Comment: BIOCHEMICALS MAY BE AFFECTED BY COLOR   APPearance CLOUDY (A) CLEAR   Specific Gravity, Urine >1.046 (H) 1.005 - 1.030   pH 6.0 5.0 - 8.0   Glucose, UA NEGATIVE NEGATIVE mg/dL   Hgb urine dipstick NEGATIVE NEGATIVE   Bilirubin Urine SMALL (A) NEGATIVE   Ketones, ur 5 (A) NEGATIVE mg/dL   Protein, ur 147 (A) NEGATIVE mg/dL   Nitrite NEGATIVE NEGATIVE   Leukocytes,Ua LARGE (A) NEGATIVE   RBC / HPF 6-10 0 - 5 RBC/hpf   WBC, UA >50 (H) 0 - 5 WBC/hpf   Bacteria, UA MANY (A) NONE SEEN   WBC Clumps PRESENT    Mucus PRESENT    Non Squamous Epithelial 6-10 (A) NONE SEEN    Comment: Performed at [facility], [address], [place], [place] [address]  SARS Coronavirus 2 by RT PCR (hospital order, performed in [facility] hospital lab) Nasopharyngeal Nasopharyngeal Swab     Status: None   Collection Time: 10/05/19 11:23 AM   Specimen: Nasopharyngeal Swab  Result Value Ref Range   SARS Coronavirus 2 NEGATIVE NEGATIVE    Comment: (NOTE) SARS-CoV-2 target nucleic acids are NOT DETECTED. The SARS-CoV-2 RNA is generally detectable in upper and lower respiratory specimens during the acute phase of infection. The lowest concentration of SARS-CoV-2 viral copies this assay can detect is 250 copies / mL. A negative result does not preclude SARS-CoV-2 infection and should not be used as the sole basis for treatment or other patient management decisions.  A negative result may occur with improper specimen collection / handling, submission of specimen other than nasopharyngeal swab, presence of viral mutation(s) within the areas targeted by this assay, and inadequate number of viral copies (<250 copies / mL). A negative result must be combined with clinical observations, patient history, and epidemiological  information. Fact Sheet for Patients:   BoilerBrush.com.cy Fact Sheet for Healthcare Providers: https://pope.com/ This test is not yet approved or cleared  by the [place] FDA and has been authorized for detection and/or diagnosis of SARS-CoV-2 by FDA under an Emergency Use Authorization (EUA).  This EUA will remain in effect (meaning this test can be used) for the duration of the COVID-19 declaration under Section 564(b)(1) of the Act, 21 U.S.C. section 360bbb-3(b)(1), unless the authorization is terminated or revoked sooner. Performed  at [facility], [place] [address], [place], [place] [address]   Lipase, blood     Status: None   Collection Time: 10/05/19 11:23 AM  Result Value Ref Range   Lipase 22 11 - 51 U/L    Comment: Performed at [facility], [place] [address] [place], [place] [address]  Lactic acid, plasma     Status: Abnormal   Collection Time: 10/05/19  1:59 PM  Result Value Ref Range   Lactic Acid, Venous 4.0 (HH) 0.5 - 1.9 mmol/L    Comment: CRITICAL RESULT CALLED TO, READ BACK BY AND VERIFIED WITH: Welton Flakes RN [id number] 10/05/19 JM Performed at [facility], [place] [address] [place], [place] [address]   Culture, blood (Routine X 2) w Reflex to ID Panel     Status: None (Preliminary result)   Collection Time: 10/05/19  2:28 PM   Specimen: BLOOD  Result Value Ref Range   Specimen Description      BLOOD RIGHT ANTECUBITAL Performed at [facility], [place] [address], [place], [place] [address]    Special Requests      BOTTLES DRAWN AEROBIC AND ANAEROBIC Blood Culture adequate volume Performed at [place] [address] [place], [place] [address]    Culture PENDING    Report Status PENDING   Protime-INR     Status: Abnormal   Collection Time: 10/05/19  3:14 PM  Result Value Ref Range   Prothrombin Time 16.6 (H) 11.4 - 15.2 seconds    INR 1.4 (H) 0.8 - 1.2    Comment: (NOTE) INR goal varies based on device and disease states. Performed at [facility], [place] [address], [place], [place] [address]   APTT     Status: None   Collection Time: 10/05/19  3:14 PM  Result Value Ref Range   aPTT 29 24 - 36 seconds    Comment: Performed at [facility], [place] [address] [place], [place] [address]  Lactic acid, plasma     Status: Abnormal   Collection Time: 10/05/19  3:15 PM  Result Value Ref Range   Lactic Acid, Venous 3.5 (HH) 0.5 - 1.9 mmol/L    Comment: CRITICAL VALUE NOTED.  VALUE IS CONSISTENT WITH PREVIOUSLY REPORTED AND CALLED VALUE. Performed at [facility], [place] [address] [place], [place] [address]   Lactic acid, plasma     Status: Abnormal   Collection Time: 10/05/19  5:15 PM  Result Value Ref Range   Lactic Acid, Venous 2.7 (HH) 0.5 - 1.9 mmol/L    Comment: CRITICAL VALUE NOTED.  VALUE IS CONSISTENT WITH PREVIOUSLY REPORTED AND CALLED VALUE. Performed at [facility] [place], [place] [address], [place], [place] [address]   Lactic acid, plasma     Status: Abnormal   Collection Time: 10/05/19  7:37 PM  Result Value Ref Range   Lactic Acid, Venous 2.5 (HH) 0.5 - 1.9 mmol/L    Comment: CRITICAL VALUE NOTED.  VALUE IS CONSISTENT WITH PREVIOUSLY REPORTED AND CALLED VALUE. Performed at [facility], [place] [address], [place], [place] [address]   CBC     Status: Abnormal   Collection Time: 10/05/19  7:41 PM  Result Value Ref Range   WBC 22.6 (H) 4.0 - 10.5 K/uL   RBC 4.18 3.87 - 5.11 MIL/uL   Hemoglobin 12.4 12.0 - 15.0 g/dL   HCT 39.3 36.0 - 46.0 %   MCV 94.0 80.0 -  100.0 fL   MCH 29.7 26.0 - 34.0 pg   MCHC 31.6 30.0 - 36.0 g/dL   RDW 49.4 49.6 - 75.9 %   Platelets 124 (L) 150 - 400 K/uL    Comment: Immature Platelet Fraction may be clinically indicated, consider ordering this additional test [id number]    nRBC 0.0 0.0 - 0.2 %     Comment: Performed at [facility], [address] [place], [place] [address]  Creatinine, serum     Status: None   Collection Time: 10/05/19  7:41 PM  Result Value Ref Range   Creatinine, Ser 0.59 0.44 - 1.00 mg/dL   GFR calc non Af Amer >60 >60 mL/min   GFR calc Af Amer >60 >60 mL/min    Comment: Performed at [facility], [address] [place], [place] [address]  TSH     Status: None   Collection Time: 10/05/19  7:45 PM  Result Value Ref Range   TSH 1.372 0.350 - 4.500 uIU/mL    Comment: Performed by a 3rd Generation assay with a functional sensitivity of <=0.01 uIU/mL. Performed at [facility], [address], [place], [place] [address]     CT Head Wo Contrast  Result Date: 10/05/2019 CLINICAL DATA:  Altered mental status EXAM: CT HEAD WITHOUT CONTRAST TECHNIQUE: Contiguous axial images were obtained from the base of the skull through the vertex without intravenous contrast. COMPARISON:  10/05/2017 FINDINGS: Brain: There is no acute intracranial hemorrhage, mass effect, or edema. Gray-white differentiation is preserved. There is no extra-axial fluid collection. Prominence of the ventricles and sulci reflects generalized parenchymal volume loss. Patchy and confluent areas of hypoattenuation in the supratentorial white matter are nonspecific but probably reflect moderate chronic microvascular ischemic changes. Vascular: There is atherosclerotic calcification at the skull base. Skull: Calvarium is unremarkable. Sinuses/Orbits: Mild mucosal thickening. Orbits are unremarkable. Other: Mastoid air cells are clear. IMPRESSION: No acute intracranial hemorrhage, mass effect, or evidence of acute infarction. Moderate chronic microvascular ischemic changes. Electronically Signed   By: Guadlupe Spanish M.D.   On: 10/05/2019 15:01   US Pelvis Complete  Result Date: 10/05/2019 CLINICAL DATA:  Mass on CT imaging EXAM: TRANSABDOMINAL  ULTRASOUND OF PELVIS TECHNIQUE: Transabdominal ultrasound examination of the pelvis was performed including evaluation of the uterus, ovaries, adnexal regions, and pelvic cul-de-sac. COMPARISON:  CT abdomen pelvis 09/05/2019 FINDINGS: Uterus Measurements: 5.6 x 2.9 x 4.0 cm = volume: 33 mL. Atrophic appearance, appropriate for age. No fibroid or mass visualized. Endometrium Thickness: 4 mm, non thickened.  Atrophic without focal abnormality. Right ovary Measurements: 2.8 x 1.5 x 1.7 cm = volume: 3.9 mL. Small 1.6 x 0.9 x 1.3 cm anechoic cystic structure possibly exophytic from or arising adjacent to the right ovary. Left ovary Measurements: 7.6 x 7.2 x 8 cm = volume: 228 mL. Possibly erroneously measurement given unclear margins of the normal left ovarian parenchyma alongside the larger anechoic cystic structures in the left adnexa. The largest measures 6.5 x 5.3 x 6.7 cm corresponding well to larger lesion seen on CT. A smaller more tubular cystic area is seen posterior measuring 4.4 x 2.6 x 5.5 cm, possibly reflecting a dilated tube/hydrosalpinx. Other findings: Small volume of free fluid is noted in the posterior cul-de-sac IMPRESSION: Corresponding to the abnormality on CT imaging is a large 6.5 x 5.3 x 6.7 cm cyst without significant internal complexity. More posteriorly an elongated 4.4 x 2.6 x 5.5 cm anechoic structure possibly reflects a dilated tube/hydrosalpinx.  Additional 1.6 x 0.9 x 1.3 cm ovarian versus paraovarian cyst in the right adnexa. While these do not demonstrate significant internal complexity or typically concerning features, size criteria warrants at minimum six-month surveillance ultrasound to assess for interval growth. Furthermore consider gynecologic consultation if not previously obtained. This recommendation follows consensus guidelines: Simple Adnexal Cysts: SRU Consensus Conference Update on Follow-up and Reporting. Radiology 2019; [id number]. Electronically Signed   By: Kreg Shropshire  M.D.   On: 10/05/2019 17:06   CT Abdomen Pelvis W Contrast  Result Date: 10/05/2019 CLINICAL DATA:  86 year old with nausea and vomiting. Suspected diverticulitis. Abnormal LFTs. EXAM: CT ABDOMEN AND PELVIS WITH CONTRAST TECHNIQUE: Multidetector CT imaging of the abdomen and pelvis was performed using the standard protocol following bolus administration of intravenous contrast. CONTRAST:  10mL OMNIPAQUE IOHEXOL 300 MG/ML  SOLN COMPARISON:  None. FINDINGS: Lower chest: Minimal pleural fluid or thickening in the posterior right chest. Minimal left posterior pleural thickening. Eccentric soft tissue adjacent to the distal right pulmonary artery on sequence 2 image 1. This appears to be causing narrowing or compression of the distal right pulmonary artery. Atherosclerotic calcifications in the visualized thoracic aorta. Patchy densities at the lung bases could represent atelectasis or areas of scarring. Hepatobiliary: Gallbladder is mildly distended. There may be a small amount of fluid adjacent to the gallbladder. No significant intrahepatic or extrahepatic biliary dilatation. Main portal venous system is patent. Pancreas: Unremarkable. No pancreatic ductal dilatation or surrounding inflammatory changes. Spleen: Normal in size without focal abnormality. Adrenals/Urinary Tract: No gross abnormality to the adrenal glands. Normal appearance of both kidneys. No appearance of the urinary bladder. Small low-density structure in the posterior left kidney is too small to definitively characterize but could represent a small cyst. No hydronephrosis. Stomach/Bowel: Normal appearance of the stomach and duodenum. Rectum is distended with a large amount of stool. Wall thickening involving the distal sigmoid colon adjacent to the rectal stool. Diverticula involving the sigmoid colon. No clear evidence for acute bowel inflammation. No evidence for a bowel obstruction. Vascular/Lymphatic: Atherosclerotic calcifications in the  abdominal aorta without aneurysm. Main visceral arteries are patent. Venous structures are unremarkable. No evidence for abdominopelvic lymphadenopathy. Reproductive: Uterus appears to be present. There is a large low-density structure in the left hemipelvis that measures 6.5 x 5.8 x 6.1 cm. There is an additional low-density collection posterior to the larger lesion that measures 3.8 x 2.5 x 4.7 cm. Findings are concerning for left ovarian/adnexal cystic lesions. Other: There may be a small amount of fluid around the gallbladder. Otherwise, no significant ascites. Negative for free air. Musculoskeletal: Mild anterolisthesis at L4-L5 secondary to facet arthropathy. Vertebral body compression deformity and fracture at T12 and likely new since the chest radiograph on 10/05/2017. IMPRESSION: 1. Low density lesions in the left hemipelvis, largest measuring up to 6.5 cm. Findings are concerning for left ovarian/adnexal cystic lesions. Recommend further characterization with pelvic ultrasound. 2. Gallbladder is mildly distended. Cannot exclude a small amount of pericholecystic fluid. Elevated bilirubin and abnormal LFTs. Findings raise concern for underlying gallbladder inflammation and recommend further characterization with right upper quadrant ultrasound. 3. Soft tissue attenuation in the region of the distal right pulmonary artery. This area is incompletely evaluated. Findings could be associated with chronic pulmonary artery thrombus or adjacent soft tissue. Findings are equivocal on this abdominal CT. This may be better characterized with CTA of the chest using with pulmonary embolism protocol. 4. Diverticulosis involving the sigmoid colon but limited evaluation of sigmoid colon due to the  large low-density pelvic collections. Rectum is distended with large amount of stool. 5. T12 compression fracture of uncertain age. These results were called by telephone at the time of interpretation on 10/05/2019 at 3:26 pm to  provider Vanetta MuldersSCOTT ZACKOWSKI , who verbally acknowledged these results. Electronically Signed   By: Richarda OverlieAdam  Henn M.D.   On: 10/05/2019 15:27   US Abdomen Limited  Result Date: 10/05/2019 CLINICAL DATA:  Possible cholecystitis on CT. EXAM: ULTRASOUND ABDOMEN LIMITED RIGHT UPPER QUADRANT COMPARISON:  CT abdomen and pelvis 10/05/2019 FINDINGS: Gallbladder: Multiple gallstones measuring up to 1.5 cm. Prominent gallbladder wall thickening up to 1.3 cm with pericholecystic fluid. No sonographic Murphy sign noted by sonographer. Common bile duct: Diameter: 5 mm Liver: Diffusely increased parenchymal echogenicity without focal lesion identified. Portal vein is patent on color Doppler imaging with normal direction of blood flow towards the liver. Other: None. IMPRESSION: 1. Distended gallbladder with gallstones, prominent gallbladder wall thickening, and pericholecystic fluid. This is concerning for acute cholecystitis in the appropriate clinical setting, although note that the sonographic Eulah PontMurphy sign was negative. 2. Echogenic liver which may reflect steatosis. Electronically Signed   By: Sebastian AcheAllen  Grady M.D.   On: 10/05/2019 17:02   DG Chest Port 1 View  Result Date: 10/05/2019 CLINICAL DATA:  Fever EXAM: PORTABLE CHEST 1 VIEW COMPARISON:  10/05/2017 FINDINGS: Chronically elevated right hemidiaphragm with right lower lobe atelectasis unchanged. Left lung is clear. Negative for heart failure infiltrate or effusion. No interval change. IMPRESSION: No active disease. Electronically Signed   By: Marlan Palauharles  Clark M.D.   On: 10/05/2019 12:09    Review of Systems  Unable to perform ROS: Age   Blood pressure 100/61, pulse 64, temperature 98.3 F (36.8 C), temperature source Oral, resp. rate (!) 21, height 5' (1.524 m), weight 59 kg, SpO2 97 %. Physical Exam  Constitutional: She is oriented to person, place, and time. She appears well-developed and well-nourished. No distress.  Sleeping.    HENT:  Head: Normocephalic and  atraumatic.  Right Ear: External ear normal.  Left Ear: External ear normal.  Eyes: Conjunctivae are normal. Right eye exhibits no discharge. Left eye exhibits no discharge.  Neck: No tracheal deviation present. No thyromegaly present.  Cardiovascular: Normal rate, regular rhythm, normal heart sounds and intact distal pulses. Exam reveals no gallop.  No murmur heard. Respiratory: Effort normal and breath sounds normal. No respiratory distress. She has no wheezes. She has no rales. She exhibits no tenderness.  GI: Soft. She exhibits distension (mildly distended). She exhibits no mass. There is no abdominal tenderness. There is no rebound and no guarding.  Musculoskeletal:        General: No tenderness, deformity or edema.     Cervical back: Neck supple.  Lymphadenopathy:    She has no cervical adenopathy.  Neurological: She is alert and oriented to person, place, and time. Coordination normal.  Skin: Skin is warm and dry. No rash noted. She is not diaphoretic. No erythema. No pallor.  Psychiatric: She has a normal mood and affect. Her behavior is normal. Judgment and thought content normal.    Assessment/Plan: Abnormal LFTs Gallstones.   Possible cholecystitis. Advanced age Frailty  Pt was not able to wake up to discuss her symptoms with me.  I spoke with her power of attorney.  She has not felt well for several days.  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  Given the patient's  frailty (needs 2 caregivers to maintain staying at home) I do not think she will do well with surgery.  We could get a HIDA scan to document if she has cholecystitis.  If she does, she may benefit from a perc chole tube.  However, with stones a perc chole tube is generally a temporizing situation and not a long term solution.    Will see if she is more awake and able to discuss tomorrow.  Also, will see if her labs have improved or  worsened.      Almond Lint 10/05/2019, 9:30 PM

## 2019-10-05 NOTE — ED Provider Notes (Signed)
Patient signed out to me at 4:30 PM.  Pending ultrasound of her pelvis and gallbladder.  Patient found to be altered with fever.  Gallbladder and liver enzymes elevated.  CT scan concerning for cholecystitis and likely ovarian mass.  Patient is 75, lives at home with caregivers.  Neighbors a power of attorney.  Patient with leukocytosis and elevated lactic acid.  Broad-spectrum IV antibiotics and 30 cc/kg IV fluids have been given.  Patient hemodynamically stable.  Ultrasound shows findings concerning for acute cholecystitis.  Given lab work believe that this is likely.  She also has large ovarian cyst.  At this point given her age likely that this is not going to be significant.  Patient is DNR.  Will focus on her acute gallbladder infection.  Will make surgery aware.  Power of attorney likely is against surgery but possibly would entertain IR intervention if a candidate.  Does want IV antibiotics at this time.  Will admit to hospitalist.  Would benefit from palliative care consult.  Mody medically stable.  Patient likely with sepsis from cholecystitis/cholangitis.  This chart was dictated using voice recognition software.  Despite best efforts to proofread,  errors can occur which can change the documentation meaning.   .Critical Care Performed by: Virgina Norfolk, DO Authorized by: Virgina Norfolk, DO   Critical care provider statement:    Critical care time (minutes):  35   Critical care was necessary to treat or prevent imminent or life-threatening deterioration of the following conditions:  Sepsis   Critical care was time spent personally by me on the following activities:  Blood draw for specimens, development of treatment plan with patient or surrogate, discussions with consultants, discussions with primary provider, evaluation of patient's response to treatment, examination of patient, obtaining history from patient or surrogate, ordering and performing treatments and interventions, ordering and  review of laboratory studies, ordering and review of radiographic studies, re-evaluation of patient's condition, pulse oximetry and review of old charts   I assumed direction of critical care for this patient from another provider in my specialty: yes         Virgina Norfolk, DO 10/05/19 1756

## 2019-10-05 NOTE — ED Triage Notes (Signed)
Patient arrived by EMS from home. Patient has weakness, cough, diarrhea, and nausea x 2 days.   Patient has an 49 gauage IV placed in LFT wrist per EMS.   1000 mg of Tylenol given and 500 ml NaCL bolus given.   Patient is confused at baseline.

## 2019-10-05 NOTE — Progress Notes (Signed)
Pharmacy Antibiotic Note  Danielle Myers is a 86 y.o. female admitted on 10/05/2019 with sepsis, possible abdominal source.  Pharmacy has been consulted for vancomycin and cefepime dosing; MD dosing flagyl.  Plan:  Cefepime 2g IV q12h  Vancomycin 1250mg  IV x 1 given in ED, continue with 750mg  IV q24h  Follow up renal function & cultures  Height: 5' (152.4 cm) Weight: 59 kg (130 lb 1.1 oz) IBW/kg (Calculated) : 45.5  Temp (24hrs), Avg:98.3 F (36.8 C), Min:98.3 F (36.8 C), Max:98.3 F (36.8 C)  Recent Labs  Lab 10/05/19 1123 10/05/19 1359 10/05/19 1515 10/05/19 1715  WBC 22.5*  --   --   --   CREATININE 0.80  --   --   --   LATICACIDVEN  --  4.0* 3.5* 2.7*    Estimated Creatinine Clearance: 30.8 mL/min (by C-G formula based on SCr of 0.8 mg/dL).    No Known Allergies  Antimicrobials this admission:  5/14 Vanc >> 5/14 Cefepime >> 5/14 Flagyl >>  Dose adjustments this admission:   Microbiology results:  5/14 COVID: neg 5/14 BCx:  Thank you for allowing pharmacy to be a part of this patient's care.  6/14, PharmD, BCPS Pharmacy: [phone] 10/05/2019 7:57 PM

## 2019-10-05 NOTE — Progress Notes (Signed)
A consult was received from an ED physician for vancomycin and cefepime per pharmacy dosing.  The patient's profile has been reviewed for ht/wt/allergies/indication/available labs.   A one time order has been placed for cefepime 2 g and vancomycin 1250 mg.  Further antibiotics/pharmacy consults should be ordered by admitting physician if indicated.                       Thank you, Valentina Gu 10/05/2019  2:00 PM

## 2019-10-05 NOTE — ED Provider Notes (Signed)
[facility]-EMERGENCY DEPT Provider Note   CSN: [id number] Arrival date & time: 10/05/19  1047     History Chief Complaint  Patient presents with  . Altered Mental Status  . Fever    Danielle Myers is a 86 y.o. female.  Patient started not feeling well a few days ago.  Brought in from EMS at home.  Neighbors also her power of attorney and she does have some home health situations.  They stated that she definitely had fevers at home.  Patient is confused at baseline but more confused currently.  Also very hard of hearing.  She had weakness cough diarrhea nausea x2 days.  Patient's past medical history is really noncontributory.  She has been very healthy but also had does not go see physicians.  Patient denies any pain here.  But does seem somnolent.  Patient baseline is that she is very hard of hearing.  Her language is limited to sort of yes and no.  Patient does walk at home without assistance but does have to hang onto the walls to get around.  Based on home medication list.  Patient has a history of hypertension hyperlipidemia.  Neighbor states she does not take any medicines however.        Past Medical History:  Diagnosis Date  . Arthritis     Patient Active Problem List   Diagnosis Date Noted  . Contact dermatitis due to poison ivy 09/08/2010  . CELLULITIS, HAND, LEFT 01/03/2008  . HYPERLIPIDEMIA 02/13/2007  . HYPERTENSION 02/13/2007  . ASTHMA 02/13/2007  . PNEUMONIA, HX OF 02/13/2007    History reviewed. No pertinent surgical history.   OB History   No obstetric history on file.     History reviewed. No pertinent family history.  Social History   Tobacco Use  . Smoking status: Never Smoker  . Smokeless tobacco: Never Used  Substance Use Topics  . Alcohol use: Yes  . Drug use: Not on file    Home Medications Prior to Admission medications   Medication Sig Start Date End Date Taking? Authorizing Provider  aspirin 81 MG EC tablet  Take 81 mg by mouth daily.     [provider]  hydrochlorothiazide (MICROZIDE) 12.5 MG capsule TAKE ONE CAPSULE BY MOUTH EVERY DAY Patient taking differently: Take 12.5 mg by mouth daily. TAKE ONE CAPSULE BY MOUTH EVERY DAY 12/06/13   Roderick Pee, MD  hydrochlorothiazide (MICROZIDE) 12.5 MG capsule TAKE 1 CAPSULE BY MOUTH EVERY DAY 02/14/14   Roderick Pee, MD  mometasone (NASONEX) 50 MCG/ACT nasal spray Place 2 sprays into the nose daily. 08/15/12   Gordy Savers, MD  Multiple Vitamin (MULTIVITAMIN) tablet Take 1 tablet by mouth daily.    [provider]    Allergies    Patient has no known allergies.  Review of Systems   Review of Systems  Unable to perform ROS: Mental status change    Physical Exam Updated Vital Signs BP 93/67   Pulse 76   Temp 98.3 F (36.8 C) (Oral)   Resp (!) 27   Ht 1.524 m (5')   Wt 59 kg   SpO2 97%   BMI 25.40 kg/m   Physical Exam Vitals and nursing note reviewed.  Constitutional:      General: She is not in acute distress.    Appearance: She is well-developed.  HENT:     Head: Normocephalic and atraumatic.  Eyes:     Extraocular Movements: Extraocular movements intact.  Conjunctiva/sclera: Conjunctivae normal.     Pupils: Pupils are equal, round, and reactive to light.  Cardiovascular:     Rate and Rhythm: Normal rate and regular rhythm.     Heart sounds: No murmur.  Pulmonary:     Effort: Pulmonary effort is normal. No respiratory distress.     Breath sounds: Normal breath sounds.  Abdominal:     Palpations: Abdomen is soft.     Tenderness: There is no abdominal tenderness.  Musculoskeletal:        General: No swelling. Normal range of motion.     Cervical back: Neck supple.  Skin:    General: Skin is warm and dry.     Capillary Refill: Capillary refill takes less than 2 seconds.  Neurological:     Mental Status: She is alert. Mental status is at baseline.     Comments: Patient awake denies any pain.   Patient seems to have global weakness to the lower extremities.  But has some movement of her upper extremities.     ED Results / Procedures / Treatments   Labs (all labs ordered are listed, but only abnormal results are displayed) Labs Reviewed  CBC WITH DIFFERENTIAL/PLATELET - Abnormal; Notable for the following components:      Result Value   WBC 22.5 (*)    Platelets 140 (*)    Neutro Abs 20.7 (*)    Lymphs Abs 0.3 (*)    Monocytes Absolute 1.2 (*)    Abs Immature Granulocytes 0.20 (*)    All other components within normal limits  COMPREHENSIVE METABOLIC PANEL - Abnormal; Notable for the following components:   Potassium 2.8 (*)    CO2 21 (*)    Glucose, Bld 139 (*)    Albumin 3.2 (*)    AST 207 (*)    ALT 154 (*)    Alkaline Phosphatase 196 (*)    Total Bilirubin 4.9 (*)    All other components within normal limits  BRAIN NATRIURETIC PEPTIDE - Abnormal; Notable for the following components:   B Natriuretic Peptide 260.6 (*)    All other components within normal limits  LACTIC ACID, PLASMA - Abnormal; Notable for the following components:   Lactic Acid, Venous 4.0 (*)    All other components within normal limits  PROTIME-INR - Abnormal; Notable for the following components:   Prothrombin Time 16.6 (*)    INR 1.4 (*)    All other components within normal limits  SARS CORONAVIRUS 2 BY RT PCR (HOSPITAL ORDER, PERFORMED IN [facility])  CULTURE, BLOOD (ROUTINE X 2)  CULTURE, BLOOD (ROUTINE X 2)  APTT  URINALYSIS, ROUTINE W REFLEX MICROSCOPIC  LACTIC ACID, PLASMA  LACTIC ACID, PLASMA  LIPASE, BLOOD    EKG None  Radiology CT Head Wo Contrast  Result Date: 10/05/2019 CLINICAL DATA:  Altered mental status EXAM: CT HEAD WITHOUT CONTRAST TECHNIQUE: Contiguous axial images were obtained from the base of the skull through the vertex without intravenous contrast. COMPARISON:  10/05/2017 FINDINGS: Brain: There is no acute intracranial hemorrhage, mass effect,  or edema. Gray-white differentiation is preserved. There is no extra-axial fluid collection. Prominence of the ventricles and sulci reflects generalized parenchymal volume loss. Patchy and confluent areas of hypoattenuation in the supratentorial white matter are nonspecific but probably reflect moderate chronic microvascular ischemic changes. Vascular: There is atherosclerotic calcification at the skull base. Skull: Calvarium is unremarkable. Sinuses/Orbits: Mild mucosal thickening. Orbits are unremarkable. Other: Mastoid air cells are clear. IMPRESSION: No acute  intracranial hemorrhage, mass effect, or evidence of acute infarction. Moderate chronic microvascular ischemic changes. Electronically Signed   By: Guadlupe Spanish M.D.   On: 10/05/2019 15:01   CT Abdomen Pelvis W Contrast  Result Date: 10/05/2019 CLINICAL DATA:  86 year old with nausea and vomiting. Suspected diverticulitis. Abnormal LFTs. EXAM: CT ABDOMEN AND PELVIS WITH CONTRAST TECHNIQUE: Multidetector CT imaging of the abdomen and pelvis was performed using the standard protocol following bolus administration of intravenous contrast. CONTRAST:  4mL OMNIPAQUE IOHEXOL 300 MG/ML  SOLN COMPARISON:  None. FINDINGS: Lower chest: Minimal pleural fluid or thickening in the posterior right chest. Minimal left posterior pleural thickening. Eccentric soft tissue adjacent to the distal right pulmonary artery on sequence 2 image 1. This appears to be causing narrowing or compression of the distal right pulmonary artery. Atherosclerotic calcifications in the visualized thoracic aorta. Patchy densities at the lung bases could represent atelectasis or areas of scarring. Hepatobiliary: Gallbladder is mildly distended. There may be a small amount of fluid adjacent to the gallbladder. No significant intrahepatic or extrahepatic biliary dilatation. Main portal venous system is patent. Pancreas: Unremarkable. No pancreatic ductal dilatation or surrounding inflammatory  changes. Spleen: Normal in size without focal abnormality. Adrenals/Urinary Tract: No gross abnormality to the adrenal glands. Normal appearance of both kidneys. No appearance of the urinary bladder. Small low-density structure in the posterior left kidney is too small to definitively characterize but could represent a small cyst. No hydronephrosis. Stomach/Bowel: Normal appearance of the stomach and duodenum. Rectum is distended with a large amount of stool. Wall thickening involving the distal sigmoid colon adjacent to the rectal stool. Diverticula involving the sigmoid colon. No clear evidence for acute bowel inflammation. No evidence for a bowel obstruction. Vascular/Lymphatic: Atherosclerotic calcifications in the abdominal aorta without aneurysm. Main visceral arteries are patent. Venous structures are unremarkable. No evidence for abdominopelvic lymphadenopathy. Reproductive: Uterus appears to be present. There is a large low-density structure in the left hemipelvis that measures 6.5 x 5.8 x 6.1 cm. There is an additional low-density collection posterior to the larger lesion that measures 3.8 x 2.5 x 4.7 cm. Findings are concerning for left ovarian/adnexal cystic lesions. Other: There may be a small amount of fluid around the gallbladder. Otherwise, no significant ascites. Negative for free air. Musculoskeletal: Mild anterolisthesis at L4-L5 secondary to facet arthropathy. Vertebral body compression deformity and fracture at T12 and likely new since the chest radiograph on 10/05/2017. IMPRESSION: 1. Low density lesions in the left hemipelvis, largest measuring up to 6.5 cm. Findings are concerning for left ovarian/adnexal cystic lesions. Recommend further characterization with pelvic ultrasound. 2. Gallbladder is mildly distended. Cannot exclude a small amount of pericholecystic fluid. Elevated bilirubin and abnormal LFTs. Findings raise concern for underlying gallbladder inflammation and recommend further  characterization with right upper quadrant ultrasound. 3. Soft tissue attenuation in the region of the distal right pulmonary artery. This area is incompletely evaluated. Findings could be associated with chronic pulmonary artery thrombus or adjacent soft tissue. Findings are equivocal on this abdominal CT. This may be better characterized with CTA of the chest using with pulmonary embolism protocol. 4. Diverticulosis involving the sigmoid colon but limited evaluation of sigmoid colon due to the large low-density pelvic collections. Rectum is distended with large amount of stool. 5. T12 compression fracture of uncertain age. These results were called by telephone at the time of interpretation on 10/05/2019 at 3:26 pm to provider Vanetta Mulders , who verbally acknowledged these results. Electronically Signed   By: Richarda Overlie  M.D.   On: 10/05/2019 15:27   DG Chest Port 1 View  Result Date: 10/05/2019 CLINICAL DATA:  Fever EXAM: PORTABLE CHEST 1 VIEW COMPARISON:  10/05/2017 FINDINGS: Chronically elevated right hemidiaphragm with right lower lobe atelectasis unchanged. Left lung is clear. Negative for heart failure infiltrate or effusion. No interval change. IMPRESSION: No active disease. Electronically Signed   By: Franchot Gallo M.D.   On: 10/05/2019 12:09    Procedures Procedures (including critical care time)  CRITICAL CARE Performed by: Fredia Sorrow Total critical care time: 40 minutes Critical care time was exclusive of separately billable procedures and treating other patients. Critical care was necessary to treat or prevent imminent or life-threatening deterioration. Critical care was time spent personally by me on the following activities: development of treatment plan with patient and/or surrogate as well as nursing, discussions with consultants, evaluation of patient's response to treatment, examination of patient, obtaining history from patient or surrogate, ordering and performing  treatments and interventions, ordering and review of laboratory studies, ordering and review of radiographic studies, pulse oximetry and re-evaluation of patient's condition.   Medications Ordered in ED Medications  0.9 %  sodium chloride infusion ( Intravenous New Bag/Given 10/05/19 1145)  sodium chloride (PF) 0.9 % injection (has no administration in time range)  vancomycin (VANCOREADY) IVPB 1250 mg/250 mL (has no administration in time range)  ceFEPIme (MAXIPIME) 2 g in sodium chloride 0.9 % 100 mL IVPB (0 g Intravenous Stopped 10/05/19 1533)  metroNIDAZOLE (FLAGYL) IVPB 500 mg (500 mg Intravenous New Bag/Given 10/05/19 1512)  iohexol (OMNIPAQUE) 300 MG/ML solution 75 mL (75 mLs Intravenous Contrast Given 10/05/19 1406)  sodium chloride 0.9 % bolus 1,000 mL (1,000 mLs Intravenous New Bag/Given 10/05/19 1533)    And  sodium chloride 0.9 % bolus 1,000 mL (1,000 mLs Intravenous New Bag/Given 10/05/19 1535)    ED Course  I have reviewed the triage vital signs and the nursing notes.  Pertinent labs & imaging results that were available during my care of the patient were reviewed by me and considered in my medical decision making (see chart for details).    MDM Rules/Calculators/A&P                      Work-up initially did not call code sepsis based on her vital signs.  Was a little concerned once she had a markedly elevated white blood cell count 22,000.  And also now has a critical lactic acid of 4.  However there could be some sort of intra-abdominal process.  This patient's liver function tests are markedly abnormal.  Including elevated bilirubin.  Head CT without acute findings.  Chest x-ray negative Covid testing negative.  I did start her on broad-spectrum antibiotics earlier.  Urinalysis still pending.  But because of the white count and the lactic acid I will go ahead and treat her with additional fluids.  Patient's blood pressures have all been above 90 but there has been a little bit of  a drift down with the most recent one at 94.  No tachycardia.  Patient will require admission.  Awaiting results of CT scan of the abdomen.  CT scan raise concerns about acute cholecystitis.  Also raise concerns about pelvic mass.  Radiology recommended ultrasound of the gallbladder area as well as ultrasound of the pelvic area.  Will be important to determine whether there is some evidence of acute cholecystitis.  Because in general surgery will need to be consulted.  Patient still will  require admission.   Final Clinical Impression(s) / ED Diagnoses Final diagnoses:  Sepsis, due to unspecified organism, unspecified whether acute organ dysfunction present [facility])    Rx / DC Orders ED Discharge Orders    None       Vanetta Mulders, MD 10/05/19 1621

## 2019-10-05 NOTE — H&P (Addendum)
History and Physical    Danielle Myers VWU:[id number] DOB: [birth year] DOA: 10/05/2019  PCP: Roderick Peeodd, Jeffrey A, MD (Inactive)  Patient coming from: Home  I have personally briefly reviewed patient's old medical [facility]  Chief Complaint:  Change in mental status   HPI: Danielle Myers is a 86 y.o. female with medical history significant of  HTN, HLD, Mild asthma, dementia, who lives alone with the assistance of home aides who presents to ed brought in by [employer]  James Rumley [phone]). Please note patient is unable to give history at this time and  history is given by POA. Per POA patient was brought in to ed due to increase somnolence,weakness, uri sxs 3 days, as well as increase stools and emesis morning of presentation.  Per POA at baseline she is oriented to person and place, however over the last 24 hours she has become more confused and lethargic. He also states that patient is very stoic and does not complain. She also dislikes doctor visits and has note followed up with her primary care as she should. She also  has not taken prescribed medication in some time. However he notes with the help of one other care giver she generally does well at home, but needs assistance with most ADLS.  ED Course:  Afeb, bp 111/58, hr 89, rr 20, sat 92% on ra  Labs: 22.5, hbg14.1 pmn93% Na144, KL2.8,cr0.8 ast 207, alt 154, alkp196, t-bili 4.9,lisase22 Lactic:4 UA:+ bnp 260 Cxr:NAD  Tx:for code sepsis in ed CT abd:? Acute cholecystitis/ confirmed by ultrasound  Left ovarian cyst  Abn finding pulmonary a. CT Head:NAD  Review of Systems: unable to obtain due to patient somnolence   Past Medical History:  Diagnosis Date  . Arthritis     History reviewed. No pertinent surgical history.   reports that she has never smoked. She has never used smokeless tobacco. She reports current alcohol use. No history on file for drug.  No Known Allergies  History reviewed. No pertinent  family history.  Prior to Admission medications   Medication Sig Start Date End Date Taking? Authorizing Provider  aspirin 81 MG EC tablet Take 81 mg by mouth daily.     [provider]  hydrochlorothiazide (MICROZIDE) 12.5 MG capsule TAKE ONE CAPSULE BY MOUTH EVERY DAY Patient taking differently: Take 12.5 mg by mouth daily. TAKE ONE CAPSULE BY MOUTH EVERY DAY 12/06/13   Roderick Peeodd, Jeffrey A, MD  hydrochlorothiazide (MICROZIDE) 12.5 MG capsule TAKE 1 CAPSULE BY MOUTH EVERY DAY 02/14/14   Roderick Peeodd, Jeffrey A, MD  mometasone (NASONEX) 50 MCG/ACT nasal spray Place 2 sprays into the nose daily. 08/15/12   Gordy SaversKwiatkowski, Peter F, MD  Multiple Vitamin (MULTIVITAMIN) tablet Take 1 tablet by mouth daily.    [provider]    Physical Exam: Vitals:   10/05/19 1730 10/05/19 1745 10/05/19 1800 10/05/19 1815  BP: 126/78 136/73 131/81 133/78  Pulse: 70 71 70 67  Resp: 17 15 10 16   Temp:      TempSrc:      SpO2: 99% 98% 97% 99%  Weight:      Height:        Constitutional: NAD, calm, comfortable Vitals:   10/05/19 1730 10/05/19 1745 10/05/19 1800 10/05/19 1815  BP: 126/78 136/73 131/81 133/78  Pulse: 70 71 70 67  Resp: 17 15 10 16   Temp:      TempSrc:      SpO2: 99% 98% 97% 99%  Weight:  Height:       GEN: appears younger that her stated age,  resting in NAD Eyes: PERRL, lids and conjunctivae normal ENMT: Mucous membranes are dry. Unable to do full exam due to patient cooperation  Neck: normal, supple, no masses, no thyromegaly Respiratory: clear to auscultation bilaterally, no wheezing, no crackles. Normal respiratory effort. No accessory muscle use.  Cardiovascular: Regular rate and rhythm, no murmurs / rubs / gallops. No extremity edema. 2+ pedal pulses. No carotid bruits.  Abdomen: no tenderness, no masses palpated. No hepatosplenomegaly. Bowel sounds positive.  Musculoskeletal: no clubbing / cyanosis. No joint deformity upper and lower extremities. Good ROM, no  contractures. Normal muscle tone.  Skin: no rashes, lesions, ulcers. No induration Neurologic: CN 2-12 grossly intact. Sensation intact,MAE x4 Psychiatric:  Alert.somnolent,  Labs on Admission: I have personally reviewed following labs and imaging studies  CBC: Recent Labs  Lab 10/05/19 1123  WBC 22.5*  NEUTROABS 20.7*  HGB 14.1  HCT 43.1  MCV 90.0  PLT 140*   Basic Metabolic Panel: Recent Labs  Lab 10/05/19 1123  NA 144  K 2.8*  CL 110  CO2 21*  GLUCOSE 139*  BUN 12  CREATININE 0.80  CALCIUM 9.1   GFR: Estimated Creatinine Clearance: 30.8 mL/min (by C-G formula based on SCr of 0.8 mg/dL). Liver Function Tests: Recent Labs  Lab 10/05/19 1123  AST 207*  ALT 154*  ALKPHOS 196*  BILITOT 4.9*  PROT 6.6  ALBUMIN 3.2*   Recent Labs  Lab 10/05/19 1123  LIPASE 22   No results for input(s): AMMONIA in the last 168 hours. Coagulation Profile: Recent Labs  Lab 10/05/19 1514  INR 1.4*   Cardiac Enzymes: No results for input(s): CKTOTAL, CKMB, CKMBINDEX, TROPONINI in the last 168 hours. BNP (last 3 results) No results for input(s): PROBNP in the last 8760 hours. HbA1C: No results for input(s): HGBA1C in the last 72 hours. CBG: No results for input(s): GLUCAP in the last 168 hours. Lipid Profile: No results for input(s): CHOL, HDL, LDLCALC, TRIG, CHOLHDL, LDLDIRECT in the last 72 hours. Thyroid Function Tests: No results for input(s): TSH, T4TOTAL, FREET4, T3FREE, THYROIDAB in the last 72 hours. Anemia Panel: No results for input(s): VITAMINB12, FOLATE, FERRITIN, TIBC, IRON, RETICCTPCT in the last 72 hours. Urine analysis:    Component Value Date/Time   COLORURINE YELLOW 10/05/2017 0538   APPEARANCEUR HAZY (A) 10/05/2017 0538   LABSPEC 1.015 10/05/2017 0538   PHURINE 7.0 10/05/2017 0538   GLUCOSEU NEGATIVE 10/05/2017 0538   HGBUR SMALL (A) 10/05/2017 0538   HGBUR trace-intact 04/15/2009 0841   BILIRUBINUR NEGATIVE 10/05/2017 0538   KETONESUR 20 (A)  10/05/2017 0538   PROTEINUR NEGATIVE 10/05/2017 0538   UROBILINOGEN 0.2 02/09/2015 1410   NITRITE NEGATIVE 10/05/2017 0538   LEUKOCYTESUR NEGATIVE 10/05/2017 0538    Radiological Exams on Admission: CT Head Wo Contrast  Result Date: 10/05/2019 CLINICAL DATA:  Altered mental status EXAM: CT HEAD WITHOUT CONTRAST TECHNIQUE: Contiguous axial images were obtained from the base of the skull through the vertex without intravenous contrast. COMPARISON:  10/05/2017 FINDINGS: Brain: There is no acute intracranial hemorrhage, mass effect, or edema. Gray-white differentiation is preserved. There is no extra-axial fluid collection. Prominence of the ventricles and sulci reflects generalized parenchymal volume loss. Patchy and confluent areas of hypoattenuation in the supratentorial white matter are nonspecific but probably reflect moderate chronic microvascular ischemic changes. Vascular: There is atherosclerotic calcification at the skull base. Skull: Calvarium is unremarkable. Sinuses/Orbits: Mild mucosal thickening. Orbits  are unremarkable. Other: Mastoid air cells are clear. IMPRESSION: No acute intracranial hemorrhage, mass effect, or evidence of acute infarction. Moderate chronic microvascular ischemic changes. Electronically Signed   By: Macy Mis M.D.   On: 10/05/2019 15:01   US Pelvis Complete  Result Date: 10/05/2019 CLINICAL DATA:  Mass on CT imaging EXAM: TRANSABDOMINAL ULTRASOUND OF PELVIS TECHNIQUE: Transabdominal ultrasound examination of the pelvis was performed including evaluation of the uterus, ovaries, adnexal regions, and pelvic cul-de-sac. COMPARISON:  CT abdomen pelvis 09/05/2019 FINDINGS: Uterus Measurements: 5.6 x 2.9 x 4.0 cm = volume: 33 mL. Atrophic appearance, appropriate for age. No fibroid or mass visualized. Endometrium Thickness: 4 mm, non thickened.  Atrophic without focal abnormality. Right ovary Measurements: 2.8 x 1.5 x 1.7 cm = volume: 3.9 mL. Small 1.6 x 0.9 x 1.3 cm  anechoic cystic structure possibly exophytic from or arising adjacent to the right ovary. Left ovary Measurements: 7.6 x 7.2 x 8 cm = volume: 228 mL. Possibly erroneously measurement given unclear margins of the normal left ovarian parenchyma alongside the larger anechoic cystic structures in the left adnexa. The largest measures 6.5 x 5.3 x 6.7 cm corresponding well to larger lesion seen on CT. A smaller more tubular cystic area is seen posterior measuring 4.4 x 2.6 x 5.5 cm, possibly reflecting a dilated tube/hydrosalpinx. Other findings: Small volume of free fluid is noted in the posterior cul-de-sac IMPRESSION: Corresponding to the abnormality on CT imaging is a large 6.5 x 5.3 x 6.7 cm cyst without significant internal complexity. More posteriorly an elongated 4.4 x 2.6 x 5.5 cm anechoic structure possibly reflects a dilated tube/hydrosalpinx. Additional 1.6 x 0.9 x 1.3 cm ovarian versus paraovarian cyst in the right adnexa. While these do not demonstrate significant internal complexity or typically concerning features, size criteria warrants at minimum six-month surveillance ultrasound to assess for interval growth. Furthermore consider gynecologic consultation if not previously obtained. This recommendation follows consensus guidelines: Simple Adnexal Cysts: SRU Consensus Conference Update on Follow-up and Reporting. Radiology 2019; [id number]. Electronically Signed   By: Lovena Le M.D.   On: 10/05/2019 17:06   CT Abdomen Pelvis W Contrast  Result Date: 10/05/2019 CLINICAL DATA:  86 year old with nausea and vomiting. Suspected diverticulitis. Abnormal LFTs. EXAM: CT ABDOMEN AND PELVIS WITH CONTRAST TECHNIQUE: Multidetector CT imaging of the abdomen and pelvis was performed using the standard protocol following bolus administration of intravenous contrast. CONTRAST:  13mL OMNIPAQUE IOHEXOL 300 MG/ML  SOLN COMPARISON:  None. FINDINGS: Lower chest: Minimal pleural fluid or thickening in the posterior  right chest. Minimal left posterior pleural thickening. Eccentric soft tissue adjacent to the distal right pulmonary artery on sequence 2 image 1. This appears to be causing narrowing or compression of the distal right pulmonary artery. Atherosclerotic calcifications in the visualized thoracic aorta. Patchy densities at the lung bases could represent atelectasis or areas of scarring. Hepatobiliary: Gallbladder is mildly distended. There may be a small amount of fluid adjacent to the gallbladder. No significant intrahepatic or extrahepatic biliary dilatation. Main portal venous system is patent. Pancreas: Unremarkable. No pancreatic ductal dilatation or surrounding inflammatory changes. Spleen: Normal in size without focal abnormality. Adrenals/Urinary Tract: No gross abnormality to the adrenal glands. Normal appearance of both kidneys. No appearance of the urinary bladder. Small low-density structure in the posterior left kidney is too small to definitively characterize but could represent a small cyst. No hydronephrosis. Stomach/Bowel: Normal appearance of the stomach and duodenum. Rectum is distended with a large amount of stool. Wall thickening involving  the distal sigmoid colon adjacent to the rectal stool. Diverticula involving the sigmoid colon. No clear evidence for acute bowel inflammation. No evidence for a bowel obstruction. Vascular/Lymphatic: Atherosclerotic calcifications in the abdominal aorta without aneurysm. Main visceral arteries are patent. Venous structures are unremarkable. No evidence for abdominopelvic lymphadenopathy. Reproductive: Uterus appears to be present. There is a large low-density structure in the left hemipelvis that measures 6.5 x 5.8 x 6.1 cm. There is an additional low-density collection posterior to the larger lesion that measures 3.8 x 2.5 x 4.7 cm. Findings are concerning for left ovarian/adnexal cystic lesions. Other: There may be a small amount of fluid around the  gallbladder. Otherwise, no significant ascites. Negative for free air. Musculoskeletal: Mild anterolisthesis at L4-L5 secondary to facet arthropathy. Vertebral body compression deformity and fracture at T12 and likely new since the chest radiograph on 10/05/2017. IMPRESSION: 1. Low density lesions in the left hemipelvis, largest measuring up to 6.5 cm. Findings are concerning for left ovarian/adnexal cystic lesions. Recommend further characterization with pelvic ultrasound. 2. Gallbladder is mildly distended. Cannot exclude a small amount of pericholecystic fluid. Elevated bilirubin and abnormal LFTs. Findings raise concern for underlying gallbladder inflammation and recommend further characterization with right upper quadrant ultrasound. 3. Soft tissue attenuation in the region of the distal right pulmonary artery. This area is incompletely evaluated. Findings could be associated with chronic pulmonary artery thrombus or adjacent soft tissue. Findings are equivocal on this abdominal CT. This may be better characterized with CTA of the chest using with pulmonary embolism protocol. 4. Diverticulosis involving the sigmoid colon but limited evaluation of sigmoid colon due to the large low-density pelvic collections. Rectum is distended with large amount of stool. 5. T12 compression fracture of uncertain age. These results were called by telephone at the time of interpretation on 10/05/2019 at 3:26 pm to provider Vanetta Mulders , who verbally acknowledged these results. Electronically Signed   By: Richarda Overlie M.D.   On: 10/05/2019 15:27   US Abdomen Limited  Result Date: 10/05/2019 CLINICAL DATA:  Possible cholecystitis on CT. EXAM: ULTRASOUND ABDOMEN LIMITED RIGHT UPPER QUADRANT COMPARISON:  CT abdomen and pelvis 10/05/2019 FINDINGS: Gallbladder: Multiple gallstones measuring up to 1.5 cm. Prominent gallbladder wall thickening up to 1.3 cm with pericholecystic fluid. No sonographic Murphy sign noted by sonographer.  Common bile duct: Diameter: 5 mm Liver: Diffusely increased parenchymal echogenicity without focal lesion identified. Portal vein is patent on color Doppler imaging with normal direction of blood flow towards the liver. Other: None. IMPRESSION: 1. Distended gallbladder with gallstones, prominent gallbladder wall thickening, and pericholecystic fluid. This is concerning for acute cholecystitis in the appropriate clinical setting, although note that the sonographic Eulah Pont sign was negative. 2. Echogenic liver which may reflect steatosis. Electronically Signed   By: Sebastian Ache M.D.   On: 10/05/2019 17:02   DG Chest Port 1 View  Result Date: 10/05/2019 CLINICAL DATA:  Fever EXAM: PORTABLE CHEST 1 VIEW COMPARISON:  10/05/2017 FINDINGS: Chronically elevated right hemidiaphragm with right lower lobe atelectasis unchanged. Left lung is clear. Negative for heart failure infiltrate or effusion. No interval change. IMPRESSION: No active disease. Electronically Signed   By: Marlan Palau M.D.   On: 10/05/2019 12:09    EKG: Independently reviewed. Sinus, incomplete rbbb, no acute ischemic changes.  Assessment/Plan   Acute cholecystitis -broad spectrum abx - POA favors conservative management -per surgery HIDA scan in am , patient is a poor candidate for surgery but possible candidate for percutaneous cholecystostomy tube  Abn  lfts  -concern for possible early cholangitis -on broad spectrum antibiotics  -lipase wnl -MRCP in am  -monitor labs /await final surgery recs   UTI -on broad spectrum abx  - f/u with culture data    Sepsis w/o shock -s/p goal directed therapy in ed  -bp mid to low 90's  -bolus ivfs x 1 liter and continue with maintenance fluid -monitor urine output  - maintain map 65 greater  -await further culture data   Hx of HTN/HLD/Mild Asthma  -patient does not currently take medications for these diagnosis  - will place prn nebs however   Mild thrombocytopenia -monitor  counts   FEN Hypokalemia  -replete prn    DVT prophylaxis: Heparin  Code Status:DNR/DNI Family Communication: discussed with POA Disposition Plan: inpatient  Consults called:Surgery Dr Donell Beers [place] care to assist with goals of care Admission status: inpatient    Lurline Del MD [facility]  If 7PM-7AM, please contact night-coverage [email or web address] Password [facility]  10/05/2019, 6:47 PM

## 2019-10-06 ENCOUNTER — Inpatient Hospital Stay (HOSPITAL_COMMUNITY): Payer: Medicare Other

## 2019-10-06 DIAGNOSIS — R7989 Other specified abnormal findings of blood chemistry: Secondary | ICD-10-CM

## 2019-10-06 DIAGNOSIS — D696 Thrombocytopenia, unspecified: Secondary | ICD-10-CM

## 2019-10-06 DIAGNOSIS — R6520 Severe sepsis without septic shock: Secondary | ICD-10-CM

## 2019-10-06 DIAGNOSIS — F0150 Vascular dementia without behavioral disturbance: Secondary | ICD-10-CM

## 2019-10-06 DIAGNOSIS — G9341 Metabolic encephalopathy: Secondary | ICD-10-CM

## 2019-10-06 LAB — COMPREHENSIVE METABOLIC PANEL
ALT: 99 U/L — ABNORMAL HIGH (ref 0–44)
AST: 96 U/L — ABNORMAL HIGH (ref 15–41)
Albumin: 2.5 g/dL — ABNORMAL LOW (ref 3.5–5.0)
Alkaline Phosphatase: 134 U/L — ABNORMAL HIGH (ref 38–126)
Anion gap: 3 — ABNORMAL LOW (ref 5–15)
BUN: 13 mg/dL (ref 8–23)
CO2: 19 mmol/L — ABNORMAL LOW (ref 22–32)
Calcium: 7.6 mg/dL — ABNORMAL LOW (ref 8.9–10.3)
Chloride: 118 mmol/L — ABNORMAL HIGH (ref 98–111)
Creatinine, Ser: 0.52 mg/dL (ref 0.44–1.00)
GFR calc Af Amer: >60 mL/min (ref >60)
GFR calc non Af Amer: >60 mL/min (ref >60)
Glucose, Bld: 88 mg/dL (ref 70–99)
Potassium: 4.5 mmol/L (ref 3.5–5.1)
Sodium: 140 mmol/L (ref 135–145)
Total Bilirubin: 3.2 mg/dL — ABNORMAL HIGH (ref 0.3–1.2)
Total Protein: 4.9 g/dL — ABNORMAL LOW (ref 6.5–8.1)

## 2019-10-06 LAB — CBC WITH DIFFERENTIAL/PLATELET
Abs Immature Granulocytes: 0.08 10*3/uL — ABNORMAL HIGH (ref 0.00–0.07)
Basophils Absolute: 0.1 10*3/uL (ref 0.0–0.1)
Basophils Relative: 0 %
Eosinophils Absolute: 0.1 10*3/uL (ref 0.0–0.5)
Eosinophils Relative: 1 %
HCT: 40.8 % (ref 36.0–46.0)
Hemoglobin: 12.9 g/dL (ref 12.0–15.0)
Immature Granulocytes: 1 %
Lymphocytes Relative: 6 %
Lymphs Abs: 1 10*3/uL (ref 0.7–4.0)
MCH: 29.2 pg (ref 26.0–34.0)
MCHC: 31.6 g/dL (ref 30.0–36.0)
MCV: 92.3 fL (ref 80.0–100.0)
Monocytes Absolute: 0.9 10*3/uL (ref 0.1–1.0)
Monocytes Relative: 6 %
Neutro Abs: 13.6 10*3/uL — ABNORMAL HIGH (ref 1.7–7.7)
Neutrophils Relative %: 86 %
Platelets: 148 10*3/uL — ABNORMAL LOW (ref 150–400)
RBC: 4.42 MIL/uL (ref 3.87–5.11)
RDW: 14.6 % (ref 11.5–15.5)
WBC: 15.8 10*3/uL — ABNORMAL HIGH (ref 4.0–10.5)
nRBC: 0 % (ref 0.0–0.2)

## 2019-10-06 LAB — CBC
HCT: 36.1 % (ref 36.0–46.0)
Hemoglobin: 11.4 g/dL — ABNORMAL LOW (ref 12.0–15.0)
MCH: 29.5 pg (ref 26.0–34.0)
MCHC: 31.6 g/dL (ref 30.0–36.0)
MCV: 93.3 fL (ref 80.0–100.0)
Platelets: 120 10*3/uL — ABNORMAL LOW (ref 150–400)
RBC: 3.87 MIL/uL (ref 3.87–5.11)
RDW: 14.4 % (ref 11.5–15.5)
WBC: 18.3 10*3/uL — ABNORMAL HIGH (ref 4.0–10.5)
nRBC: 0 % (ref 0.0–0.2)

## 2019-10-06 LAB — CBG MONITORING, ED: Glucose-Capillary: 73 mg/dL (ref 70–99)

## 2019-10-06 LAB — LACTIC ACID, PLASMA: Lactic Acid, Venous: 1.4 mmol/L (ref 0.5–1.9)

## 2019-10-06 LAB — MRSA PCR SCREENING: MRSA by PCR: NEGATIVE

## 2019-10-06 MED ORDER — DEXTROSE IN LACTATED RINGERS 5 % IV SOLN
INTRAVENOUS | Status: DC
  Filled 2019-10-06: qty 1000

## 2019-10-06 MED ORDER — TECHNETIUM TC 99M MEBROFENIN IV KIT
7.5000 | PACK | Freq: Once | INTRAVENOUS | Status: AC | PRN
  Administered 2019-10-06: 7.5 via INTRAVENOUS

## 2019-10-06 MED ORDER — SODIUM CHLORIDE 0.9 % IV SOLN
INTRAVENOUS | Status: DC | PRN
  Administered 2019-10-06: 1000 mL via INTRAVENOUS

## 2019-10-06 MED ORDER — ENALAPRILAT 1.25 MG/ML IV SOLN
0.6250 mg | Freq: Three times a day (TID) | INTRAVENOUS | Status: DC
  Administered 2019-10-06 (×2): 0.625 mg via INTRAVENOUS
  Filled 2019-10-06 (×3): qty 0.5

## 2019-10-06 MED ORDER — DEXTROSE-NACL 5-0.45 % IV SOLN: INTRAVENOUS | Status: DC

## 2019-10-06 MED ORDER — HALOPERIDOL LACTATE 5 MG/ML IJ SOLN
2.0000 mg | Freq: Once | INTRAMUSCULAR | Status: DC
  Filled 2019-10-06: qty 1

## 2019-10-06 MED ORDER — GADOBUTROL 1 MMOL/ML IV SOLN
6.0000 mL | Freq: Once | INTRAVENOUS | Status: AC | PRN
  Administered 2019-10-06: 6 mL via INTRAVENOUS

## 2019-10-06 NOTE — Progress Notes (Addendum)
PROGRESS NOTE    Danielle Myers  BJS:[id number] DOB: [birth year] DOA: 10/05/2019 PCP: Roderick Pee, MD (Inactive)    Chief Complaint  Patient presents with  . Altered Mental Status  . Fever    Brief Narrative:  Chief Complaint:  Change in mental status   HPI: Danielle Myers is a 86 y.o. female with medical history significant of  HTN, HLD, Mild asthma, dementia, who lives alone with the assistance of home aides who presents to ed brought in by [employer] [phone]). Please note patient is unable to give history at this time and  history is given by POA. Per POA patient was brought in to ed due to increase somnolence,weakness, uri sxs 3 days, as well as increase stools and emesis morning of presentation.  Per POA at baseline she is oriented to person and place, however over the last 24 hours she has become more confused and lethargic. He also states that patient is very stoic and does not complain. She also dislikes doctor visits and has note followed up with her primary care as she should. She also  has not taken prescribed medication in some time. However he notes with the help of one other care giver she generally does well at home, but needs assistance with most ADLS.  She is found to be in severe sepsis suspect source from acute cholecystitis, general surgery consulted  Subjective:  Lethargic, confused  Assessment & Plan:   Active Problems:   Sepsis (HCC)  Sepsis present on admission with acute metabolic encephalopathy, source of infection acute cholecystitis plus minus UTI -CT head no acute findings -Has leukocytosis on presentation WBC 22.5, lactic acidosis lactic acid 4 -chest x-ray no acute findings, Blood culture obtained in the ED no growth, UA suspect for UTI, urine culture was not ordered prior to abx, will add on urine culture , however she has already received abx treatment.  Acute cholecystitis: MRCP:  There are multiple gallstones identified within  the gallbladder. There is gallbladder wall enhancement and progressive right upper quadrant and pericholecystic fluid. Cannot rule out acute cholecystitis.  No convincing evidence for choledocholithiasis. No evidence for CBD or main pancreatic duct dilatation. HIDA scan: 1. Nonvisualization of the gallbladder after 2 hours of imaging compatible with cystic duct obstruction due to cholecystitis. 2. Delayed biliary accumulation which may reflect underlying cholestasis. She started on vanc, Cefepime and Flagyl, awaiting for mrsa screening, likely able to d/c vanc if mrsa screening is negative Will follow general surgery recommendation   Elevated LFT, likely multifactorial including sepsis and acute cholecystitis  Mild thrombocytopenia, likely due to sepsis, monitor  Hypertension She initially presented with borderline low blood pressure , now blood pressure started to increase, she is n.p.o., she has tendency to have bradycardia, IV hydralazine is on national shortage, will start IV Vasotec  Dementia; baseline oriented to place and person , currently have acute metabolic encephalopathy   DVT prophylaxis: hold subq heparin , patient may need to have gallbladder drain placement, start scd's Code Status:DNR Family Communication: not at bedside  Disposition:   Status is: Inpatient    Dispo: The patient is from: Home              Anticipated d/c is to: TBD              Anticipated d/c date is: TBD              Patient currently is severely ill  Consultants:   General  surgery  Procedures:   MRCP  HIDA scan  Antimicrobials:   Vanco Cefepime Flagyl     Objective: Vitals:   10/06/19 0700 10/06/19 0715 10/06/19 0730 10/06/19 0745  BP: (!) 165/70 (!) 149/87 (!) 152/70 (!) 163/68  Pulse: (!) 50 (!) 58 (!) 54 (!) 53  Resp: 15 19 (!) 25 13  Temp:      TempSrc:      SpO2: 99% 100% 99% 97%  Weight:      Height:        Intake/Output Summary (Last 24 hours) at 10/06/2019  0908 Last data filed at 10/06/2019 0348 Gross per 24 hour  Intake 4110.92 ml  Output --  Net 4110.92 ml   Filed Weights   10/05/19 1122  Weight: 59 kg    Examination:  General exam: Confused, lethargic, hard of hearing Respiratory system: Clear to auscultation. Respiratory effort normal. Cardiovascular system: S1 & S2 heard, RRR. No JVD, no murmur, No pedal edema. Gastrointestinal system: Grimacing when being touched . Normal bowel sounds heard. Central nervous system: Confused and lethargic Extremities: Moving all extremities spontaneously Skin: No rashes, lesions or ulcers Psychiatry: Confused and lethargic    Data Reviewed: I have personally reviewed following labs and imaging studies  CBC: Recent Labs  Lab 10/05/19 1123 10/05/19 1941 10/06/19 0440  WBC 22.5* 22.6* 18.3*  NEUTROABS 20.7*  --   --   HGB 14.1 12.4 11.4*  HCT 43.1 39.3 36.1  MCV 90.0 94.0 93.3  PLT 140* 124* 120*    Basic Metabolic Panel: Recent Labs  Lab 10/05/19 1123 10/05/19 1941 10/06/19 0440  NA 144  --  140  K 2.8*  --  4.5  CL 110  --  118*  CO2 21*  --  19*  GLUCOSE 139*  --  88  BUN 12  --  13  CREATININE 0.80 0.59 0.52  CALCIUM 9.1  --  7.6*    GFR: Estimated Creatinine Clearance: 30.8 mL/min (by C-G formula based on SCr of 0.52 mg/dL).  Liver Function Tests: Recent Labs  Lab 10/05/19 1123 10/06/19 0440  AST 207* 96*  ALT 154* 99*  ALKPHOS 196* 134*  BILITOT 4.9* 3.2*  PROT 6.6 4.9*  ALBUMIN 3.2* 2.5*    CBG: Recent Labs  Lab 10/06/19 0850  GLUCAP 73     Recent Results (from the past 240 hour(s))  SARS Coronavirus 2 by RT PCR (hospital order, performed in [facility] hospital lab) Nasopharyngeal Nasopharyngeal Swab     Status: None   Collection Time: 10/05/19 11:23 AM   Specimen: Nasopharyngeal Swab  Result Value Ref Range Status   SARS Coronavirus 2 NEGATIVE NEGATIVE Final    Comment: (NOTE) SARS-CoV-2 target nucleic acids are NOT DETECTED. The  SARS-CoV-2 RNA is generally detectable in upper and lower respiratory specimens during the acute phase of infection. The lowest concentration of SARS-CoV-2 viral copies this assay can detect is 250 copies / mL. A negative result does not preclude SARS-CoV-2 infection and should not be used as the sole basis for treatment or other patient management decisions.  A negative result may occur with improper specimen collection / handling, submission of specimen other than nasopharyngeal swab, presence of viral mutation(s) within the areas targeted by this assay, and inadequate number of viral copies (<250 copies / mL). A negative result must be combined with clinical observations, patient history, and epidemiological information. Fact Sheet for Patients:   BoilerBrush.com.cy Fact Sheet for Healthcare Providers: https://pope.com/ This test is not  yet approved or cleared  by the [place] and has been authorized for detection and/or diagnosis of SARS-CoV-2 by FDA under an Emergency Use Authorization (EUA).  This EUA will remain in effect (meaning this test can be used) for the duration of the COVID-19 declaration under Section 564(b)(1) of the Act, 21 U.S.C. section 360bbb-3(b)(1), unless the authorization is terminated or revoked sooner. Performed at [facility], [address] [place], [place] [address]   Culture, blood (Routine X 2) w Reflex to ID Panel     Status: None (Preliminary result)   Collection Time: 10/05/19  1:31 PM   Specimen: BLOOD RIGHT ARM  Result Value Ref Range Status   Specimen Description   Final    BLOOD RIGHT ARM Performed at [facility], [address], [place], [place] [address]    Special Requests   Final    BOTTLES DRAWN AEROBIC AND ANAEROBIC Blood Culture adequate volume Performed at [facility], [address] [place], [place] [address]     Culture   Final    NO GROWTH < 24 HOURS Performed at [facility], [address], [place], [place] [address]    Report Status PENDING  Incomplete  Culture, blood (Routine X 2) w Reflex to ID Panel     Status: None (Preliminary result)   Collection Time: 10/05/19  2:28 PM   Specimen: BLOOD  Result Value Ref Range Status   Specimen Description   Final    BLOOD RIGHT ANTECUBITAL Performed at [facility], [address] [place], [place] [address]    Special Requests   Final    BOTTLES DRAWN AEROBIC AND ANAEROBIC Blood Culture adequate volume Performed at [facility], [address], [place], [place] [address]    Culture   Final    NO GROWTH < 24 HOURS Performed at [facility], [address] [place], [place] [address]    Report Status PENDING  Incomplete         Radiology Studies: CT Head Wo Contrast  Result Date: 10/05/2019 CLINICAL DATA:  Altered mental status EXAM: CT HEAD WITHOUT CONTRAST TECHNIQUE: Contiguous axial images were obtained from the base of the skull through the vertex without intravenous contrast. COMPARISON:  10/05/2017 FINDINGS: Brain: There is no acute intracranial hemorrhage, mass effect, or edema. Gray-white differentiation is preserved. There is no extra-axial fluid collection. Prominence of the ventricles and sulci reflects generalized parenchymal volume loss. Patchy and confluent areas of hypoattenuation in the supratentorial white matter are nonspecific but probably reflect moderate chronic microvascular ischemic changes. Vascular: There is atherosclerotic calcification at the skull base. Skull: Calvarium is unremarkable. Sinuses/Orbits: Mild mucosal thickening. Orbits are unremarkable. Other: Mastoid air cells are clear. IMPRESSION: No acute intracranial hemorrhage, mass effect, or evidence of acute infarction. Moderate chronic microvascular ischemic changes. Electronically Signed   By: Guadlupe Spanish M.D.   On:  10/05/2019 15:01   US Pelvis Complete  Result Date: 10/05/2019 CLINICAL DATA:  Mass on CT imaging EXAM: TRANSABDOMINAL ULTRASOUND OF PELVIS TECHNIQUE: Transabdominal ultrasound examination of the pelvis was performed including evaluation of the uterus, ovaries, adnexal regions, and pelvic cul-de-sac. COMPARISON:  CT abdomen pelvis 09/05/2019 FINDINGS: Uterus Measurements: 5.6 x 2.9 x 4.0 cm = volume: 33 mL. Atrophic appearance, appropriate for age. No fibroid or mass visualized. Endometrium Thickness: 4 mm, non thickened.  Atrophic without focal abnormality. Right ovary Measurements: 2.8 x 1.5 x 1.7 cm =  volume: 3.9 mL. Small 1.6 x 0.9 x 1.3 cm anechoic cystic structure possibly exophytic from or arising adjacent to the right ovary. Left ovary Measurements: 7.6 x 7.2 x 8 cm = volume: 228 mL. Possibly erroneously measurement given unclear margins of the normal left ovarian parenchyma alongside the larger anechoic cystic structures in the left adnexa. The largest measures 6.5 x 5.3 x 6.7 cm corresponding well to larger lesion seen on CT. A smaller more tubular cystic area is seen posterior measuring 4.4 x 2.6 x 5.5 cm, possibly reflecting a dilated tube/hydrosalpinx. Other findings: Small volume of free fluid is noted in the posterior cul-de-sac IMPRESSION: Corresponding to the abnormality on CT imaging is a large 6.5 x 5.3 x 6.7 cm cyst without significant internal complexity. More posteriorly an elongated 4.4 x 2.6 x 5.5 cm anechoic structure possibly reflects a dilated tube/hydrosalpinx. Additional 1.6 x 0.9 x 1.3 cm ovarian versus paraovarian cyst in the right adnexa. While these do not demonstrate significant internal complexity or typically concerning features, size criteria warrants at minimum six-month surveillance ultrasound to assess for interval growth. Furthermore consider gynecologic consultation if not previously obtained. This recommendation follows consensus guidelines: Simple Adnexal Cysts: SRU  Consensus Conference Update on Follow-up and Reporting. Radiology 2019; [id number]. Electronically Signed   By: Kreg Shropshire M.D.   On: 10/05/2019 17:06   CT Abdomen Pelvis W Contrast  Result Date: 10/05/2019 CLINICAL DATA:  86 year old with nausea and vomiting. Suspected diverticulitis. Abnormal LFTs. EXAM: CT ABDOMEN AND PELVIS WITH CONTRAST TECHNIQUE: Multidetector CT imaging of the abdomen and pelvis was performed using the standard protocol following bolus administration of intravenous contrast. CONTRAST:  75mL OMNIPAQUE IOHEXOL 300 MG/ML  SOLN COMPARISON:  None. FINDINGS: Lower chest: Minimal pleural fluid or thickening in the posterior right chest. Minimal left posterior pleural thickening. Eccentric soft tissue adjacent to the distal right pulmonary artery on sequence 2 image 1. This appears to be causing narrowing or compression of the distal right pulmonary artery. Atherosclerotic calcifications in the visualized thoracic aorta. Patchy densities at the lung bases could represent atelectasis or areas of scarring. Hepatobiliary: Gallbladder is mildly distended. There may be a small amount of fluid adjacent to the gallbladder. No significant intrahepatic or extrahepatic biliary dilatation. Main portal venous system is patent. Pancreas: Unremarkable. No pancreatic ductal dilatation or surrounding inflammatory changes. Spleen: Normal in size without focal abnormality. Adrenals/Urinary Tract: No gross abnormality to the adrenal glands. Normal appearance of both kidneys. No appearance of the urinary bladder. Small low-density structure in the posterior left kidney is too small to definitively characterize but could represent a small cyst. No hydronephrosis. Stomach/Bowel: Normal appearance of the stomach and duodenum. Rectum is distended with a large amount of stool. Wall thickening involving the distal sigmoid colon adjacent to the rectal stool. Diverticula involving the sigmoid colon. No clear evidence for  acute bowel inflammation. No evidence for a bowel obstruction. Vascular/Lymphatic: Atherosclerotic calcifications in the abdominal aorta without aneurysm. Main visceral arteries are patent. Venous structures are unremarkable. No evidence for abdominopelvic lymphadenopathy. Reproductive: Uterus appears to be present. There is a large low-density structure in the left hemipelvis that measures 6.5 x 5.8 x 6.1 cm. There is an additional low-density collection posterior to the larger lesion that measures 3.8 x 2.5 x 4.7 cm. Findings are concerning for left ovarian/adnexal cystic lesions. Other: There may be a small amount of fluid around the gallbladder. Otherwise, no significant ascites. Negative for free air. Musculoskeletal: Mild anterolisthesis at L4-L5 secondary to facet arthropathy.  Vertebral body compression deformity and fracture at T12 and likely new since the chest radiograph on 10/05/2017. IMPRESSION: 1. Low density lesions in the left hemipelvis, largest measuring up to 6.5 cm. Findings are concerning for left ovarian/adnexal cystic lesions. Recommend further characterization with pelvic ultrasound. 2. Gallbladder is mildly distended. Cannot exclude a small amount of pericholecystic fluid. Elevated bilirubin and abnormal LFTs. Findings raise concern for underlying gallbladder inflammation and recommend further characterization with right upper quadrant ultrasound. 3. Soft tissue attenuation in the region of the distal right pulmonary artery. This area is incompletely evaluated. Findings could be associated with chronic pulmonary artery thrombus or adjacent soft tissue. Findings are equivocal on this abdominal CT. This may be better characterized with CTA of the chest using with pulmonary embolism protocol. 4. Diverticulosis involving the sigmoid colon but limited evaluation of sigmoid colon due to the large low-density pelvic collections. Rectum is distended with large amount of stool. 5. T12 compression  fracture of uncertain age. These results were called by telephone at the time of interpretation on 10/05/2019 at 3:26 pm to provider Vanetta MuldersSCOTT ZACKOWSKI , who verbally acknowledged these results. Electronically Signed   By: Richarda OverlieAdam  Henn M.D.   On: 10/05/2019 15:27   US Abdomen Limited  Result Date: 10/05/2019 CLINICAL DATA:  Possible cholecystitis on CT. EXAM: ULTRASOUND ABDOMEN LIMITED RIGHT UPPER QUADRANT COMPARISON:  CT abdomen and pelvis 10/05/2019 FINDINGS: Gallbladder: Multiple gallstones measuring up to 1.5 cm. Prominent gallbladder wall thickening up to 1.3 cm with pericholecystic fluid. No sonographic Murphy sign noted by sonographer. Common bile duct: Diameter: 5 mm Liver: Diffusely increased parenchymal echogenicity without focal lesion identified. Portal vein is patent on color Doppler imaging with normal direction of blood flow towards the liver. Other: None. IMPRESSION: 1. Distended gallbladder with gallstones, prominent gallbladder wall thickening, and pericholecystic fluid. This is concerning for acute cholecystitis in the appropriate clinical setting, although note that the sonographic Eulah PontMurphy sign was negative. 2. Echogenic liver which may reflect steatosis. Electronically Signed   By: Sebastian AcheAllen  Grady M.D.   On: 10/05/2019 17:02   DG Chest Port 1 View  Result Date: 10/05/2019 CLINICAL DATA:  Fever EXAM: PORTABLE CHEST 1 VIEW COMPARISON:  10/05/2017 FINDINGS: Chronically elevated right hemidiaphragm with right lower lobe atelectasis unchanged. Left lung is clear. Negative for heart failure infiltrate or effusion. No interval change. IMPRESSION: No active disease. Electronically Signed   By: Marlan Palauharles  Clark M.D.   On: 10/05/2019 12:09        Scheduled Meds: . heparin  5,000 Units Subcutaneous Q8H   Continuous Infusions: . ceFEPime (MAXIPIME) IV Stopped (10/06/19 0310)  . dextrose 5% lactated ringers    . metronidazole 500 mg (10/06/19 0716)  . vancomycin       LOS: 1 day     Time  spent: 35mins I have personally reviewed and interpreted on  10/06/2019 daily labs, tele strips, imagings as discussed above under date review session and assessment and plans.  I reviewed all nursing notes, pharmacy notes, consultant notes,  vitals, pertinent old records  I have discussed plan of care as described above with RN , patient  on 10/06/2019  Voice Recognition /Dragon dictation system was used to create this note, attempts have been made to correct errors. Please contact the author with questions and/or clarifications.   Albertine GratesFang Koty Anctil, MD PhD FACP [facility]  Available via Epic secure chat 7am-7pm for nonurgent issues Please page for urgent issues To page the attending provider between 7A-7P or the covering provider  during after hours 7P-7A, please log into the web site [email or web address] and access using universal [place] password for that web site. If you do not have the password, please call the hospital operator.    10/06/2019, 9:08 AM

## 2019-10-06 NOTE — ED Notes (Signed)
Notified Gina Nuc. Med. Tech. Patient needs NM hepatobiliary Liver function.

## 2019-10-06 NOTE — ED Notes (Signed)
Called MRI tech Aundra Millet and she said she has to get the patients history from a family member before proceeding with the MRI.

## 2019-10-06 NOTE — ED Notes (Signed)
Patient TO MRI

## 2019-10-06 NOTE — ED Notes (Signed)
PT to Nuc MED

## 2019-10-06 NOTE — Progress Notes (Signed)
[facility]:  [phone] General Surgery Progress Note   LOS: 1 day  POD -     Chief Complaint: Confusion  Assessment and Plan: 1  Cholelithiasis with possible cholecystitis  WBC - 18,300 - 10/06/2019  MRI of abdomen 10/06/2019 - motion artifact, multiple gall stones, no evidence of choledocholithiasis  Maxipime/Flagyl/Vanc  I spoke to Dr. Donnel Saxon. [place], who did the HIDA scan.  The scan is equivocal - and she appears to have some underlying hepatocellular disease (the contrast stays in the liver longer than it should)  This is a difficult problem, in that surgery would carry significant risk for her.  For now, would treat with antibiotics alone and see how she does.  Her PE is not very remarkable.  She cannot give a good history.  Will start clear liquids and see how she does.  2.  Abnormal LFT's, but better today  4.9 (5/14) --> 3.2 (5/15) 3.  Malnutrition  Albumin - 2.5 - 10/06/2019 4.  Advanced age and frailty   Active Problems:   Sepsis (HCC)  Subjective:  Very old frail woman.  She does respond to questions, but can not give a history.  Objective:   Vitals:   10/06/19 0900 10/06/19 0915  BP: (!) 159/71 (!) 143/90  Pulse: (!) 54   Resp: 12 14  Temp:    SpO2: 97%      Intake/Output from previous day:  05/14 0701 - 05/15 0700 In: 4110.9 [I.V.:163.4; IV Piggyback:3947.5] Out: -   Intake/Output this shift:  Total I/O In: 100 [IV Piggyback:100] Out: -    Physical Exam:   General: Thin older WF who is alert.     HEENT: Normal. Pupils equal. .   Lungs: Clear.   Abdomen:  No localized abdominal tenderness.   Lab Results:    Recent Labs    10/05/19 1941 10/06/19 0440  WBC 22.6* 18.3*  HGB 12.4 11.4*  HCT 39.3 36.1  PLT 124* 120*    BMET   Recent Labs    10/05/19 1123 10/05/19 1123 10/05/19 1941 10/06/19 0440  NA 144  --   --  140  K 2.8*  --   --  4.5  CL 110  --   --  118*  CO2 21*  --   --  19*  GLUCOSE 139*  --   --  88   BUN 12  --   --  13  CREATININE 0.80   < > 0.59 0.52  CALCIUM 9.1  --   --  7.6*   < > = values in this interval not displayed.    PT/INR   Recent Labs    10/05/19 1514  LABPROT 16.6*  INR 1.4*    ABG  No results for input(s): PHART, HCO3 in the last 72 hours.  Invalid input(s): PCO2, PO2   Studies/Results:  CT Head Wo Contrast  Result Date: 10/05/2019 CLINICAL DATA:  Altered mental status EXAM: CT HEAD WITHOUT CONTRAST TECHNIQUE: Contiguous axial images were obtained from the base of the skull through the vertex without intravenous contrast. COMPARISON:  10/05/2017 FINDINGS: Brain: There is no acute intracranial hemorrhage, mass effect, or edema. Gray-white differentiation is preserved. There is no extra-axial fluid collection. Prominence of the ventricles and sulci reflects generalized parenchymal volume loss. Patchy and confluent areas of hypoattenuation in the supratentorial white matter are nonspecific but probably reflect moderate chronic microvascular ischemic changes. Vascular: There is atherosclerotic calcification at the skull base. Skull: Calvarium is unremarkable.  Sinuses/Orbits: Mild mucosal thickening. Orbits are unremarkable. Other: Mastoid air cells are clear. IMPRESSION: No acute intracranial hemorrhage, mass effect, or evidence of acute infarction. Moderate chronic microvascular ischemic changes. Electronically Signed   By: Guadlupe Spanish M.D.   On: 10/05/2019 15:01   US Pelvis Complete  Result Date: 10/05/2019 CLINICAL DATA:  Mass on CT imaging EXAM: TRANSABDOMINAL ULTRASOUND OF PELVIS TECHNIQUE: Transabdominal ultrasound examination of the pelvis was performed including evaluation of the uterus, ovaries, adnexal regions, and pelvic cul-de-sac. COMPARISON:  CT abdomen pelvis 09/05/2019 FINDINGS: Uterus Measurements: 5.6 x 2.9 x 4.0 cm = volume: 33 mL. Atrophic appearance, appropriate for age. No fibroid or mass visualized. Endometrium Thickness: 4 mm, non thickened.   Atrophic without focal abnormality. Right ovary Measurements: 2.8 x 1.5 x 1.7 cm = volume: 3.9 mL. Small 1.6 x 0.9 x 1.3 cm anechoic cystic structure possibly exophytic from or arising adjacent to the right ovary. Left ovary Measurements: 7.6 x 7.2 x 8 cm = volume: 228 mL. Possibly erroneously measurement given unclear margins of the normal left ovarian parenchyma alongside the larger anechoic cystic structures in the left adnexa. The largest measures 6.5 x 5.3 x 6.7 cm corresponding well to larger lesion seen on CT. A smaller more tubular cystic area is seen posterior measuring 4.4 x 2.6 x 5.5 cm, possibly reflecting a dilated tube/hydrosalpinx. Other findings: Small volume of free fluid is noted in the posterior cul-de-sac IMPRESSION: Corresponding to the abnormality on CT imaging is a large 6.5 x 5.3 x 6.7 cm cyst without significant internal complexity. More posteriorly an elongated 4.4 x 2.6 x 5.5 cm anechoic structure possibly reflects a dilated tube/hydrosalpinx. Additional 1.6 x 0.9 x 1.3 cm ovarian versus paraovarian cyst in the right adnexa. While these do not demonstrate significant internal complexity or typically concerning features, size criteria warrants at minimum six-month surveillance ultrasound to assess for interval growth. Furthermore consider gynecologic consultation if not previously obtained. This recommendation follows consensus guidelines: Simple Adnexal Cysts: SRU Consensus Conference Update on Follow-up and Reporting. Radiology 2019; [id number]. Electronically Signed   By: Kreg Shropshire M.D.   On: 10/05/2019 17:06   CT Abdomen Pelvis W Contrast  Result Date: 10/05/2019 CLINICAL DATA:  86 year old with nausea and vomiting. Suspected diverticulitis. Abnormal LFTs. EXAM: CT ABDOMEN AND PELVIS WITH CONTRAST TECHNIQUE: Multidetector CT imaging of the abdomen and pelvis was performed using the standard protocol following bolus administration of intravenous contrast. CONTRAST:  77mL  OMNIPAQUE IOHEXOL 300 MG/ML  SOLN COMPARISON:  None. FINDINGS: Lower chest: Minimal pleural fluid or thickening in the posterior right chest. Minimal left posterior pleural thickening. Eccentric soft tissue adjacent to the distal right pulmonary artery on sequence 2 image 1. This appears to be causing narrowing or compression of the distal right pulmonary artery. Atherosclerotic calcifications in the visualized thoracic aorta. Patchy densities at the lung bases could represent atelectasis or areas of scarring. Hepatobiliary: Gallbladder is mildly distended. There may be a small amount of fluid adjacent to the gallbladder. No significant intrahepatic or extrahepatic biliary dilatation. Main portal venous system is patent. Pancreas: Unremarkable. No pancreatic ductal dilatation or surrounding inflammatory changes. Spleen: Normal in size without focal abnormality. Adrenals/Urinary Tract: No gross abnormality to the adrenal glands. Normal appearance of both kidneys. No appearance of the urinary bladder. Small low-density structure in the posterior left kidney is too small to definitively characterize but could represent a small cyst. No hydronephrosis. Stomach/Bowel: Normal appearance of the stomach and duodenum. Rectum is distended with a large amount  of stool. Wall thickening involving the distal sigmoid colon adjacent to the rectal stool. Diverticula involving the sigmoid colon. No clear evidence for acute bowel inflammation. No evidence for a bowel obstruction. Vascular/Lymphatic: Atherosclerotic calcifications in the abdominal aorta without aneurysm. Main visceral arteries are patent. Venous structures are unremarkable. No evidence for abdominopelvic lymphadenopathy. Reproductive: Uterus appears to be present. There is a large low-density structure in the left hemipelvis that measures 6.5 x 5.8 x 6.1 cm. There is an additional low-density collection posterior to the larger lesion that measures 3.8 x 2.5 x 4.7 cm.  Findings are concerning for left ovarian/adnexal cystic lesions. Other: There may be a small amount of fluid around the gallbladder. Otherwise, no significant ascites. Negative for free air. Musculoskeletal: Mild anterolisthesis at L4-L5 secondary to facet arthropathy. Vertebral body compression deformity and fracture at T12 and likely new since the chest radiograph on 10/05/2017. IMPRESSION: 1. Low density lesions in the left hemipelvis, largest measuring up to 6.5 cm. Findings are concerning for left ovarian/adnexal cystic lesions. Recommend further characterization with pelvic ultrasound. 2. Gallbladder is mildly distended. Cannot exclude a small amount of pericholecystic fluid. Elevated bilirubin and abnormal LFTs. Findings raise concern for underlying gallbladder inflammation and recommend further characterization with right upper quadrant ultrasound. 3. Soft tissue attenuation in the region of the distal right pulmonary artery. This area is incompletely evaluated. Findings could be associated with chronic pulmonary artery thrombus or adjacent soft tissue. Findings are equivocal on this abdominal CT. This may be better characterized with CTA of the chest using with pulmonary embolism protocol. 4. Diverticulosis involving the sigmoid colon but limited evaluation of sigmoid colon due to the large low-density pelvic collections. Rectum is distended with large amount of stool. 5. T12 compression fracture of uncertain age. These results were called by telephone at the time of interpretation on 10/05/2019 at 3:26 pm to provider Vanetta Mulders , who verbally acknowledged these results. Electronically Signed   By: Richarda Overlie M.D.   On: 10/05/2019 15:27   MR 3D Recon At Scanner  Result Date: 10/06/2019 CLINICAL DATA:  Abdominal pain. Suspect cholecystitis. EXAM: MRI ABDOMEN WITHOUT AND WITH CONTRAST (INCLUDING MRCP) TECHNIQUE: Multiplanar multisequence MR imaging of the abdomen was performed both before and after  the administration of intravenous contrast. Heavily T2-weighted images of the biliary and pancreatic ducts were obtained, and three-dimensional MRCP images were rendered by post processing. CONTRAST:  12mL GADAVIST GADOBUTROL 1 MMOL/ML IV SOLN COMPARISON:  CT AP 10/05/2019 FINDINGS: Lower chest: Small to moderate bilateral pleural effusions. Hepatobiliary: 5 mm cyst identified within the anterior right lobe of liver. Postcontrast imaging is limited due to excessive motion artifact. Multiple stones are identified within the gallbladder which measure up to 9 mm. Gallbladder wall enhancement and mild edema noted. Right upper quadrant pericholecystic fluid is identified. The common bile duct is nondilated measuring 4 mm. No convincing evidence for choledocholithiasis. Pancreas: Evaluation of the pancreas is significantly diminished due to motion artifact. No findings to suggest main duct dilatation, inflammation or mass. Spleen:  Within normal limits in size and appearance. Adrenals/Urinary Tract: No masses identified. No evidence of hydronephrosis. Stomach/Bowel: Stomach appears nondistended. No dilated loops of bowel identified within the upper abdomen. Vascular/Lymphatic: No pathologically enlarged lymph nodes identified. No abdominal aortic aneurysm demonstrated. Other: Right upper quadrant free fluid is identified and appears increased from the previous exam. Musculoskeletal: T12 compression deformity is again noted. IMPRESSION: 1. Examination is significantly diminished due to motion artifact. 2. There are multiple gallstones identified within  the gallbladder. There is gallbladder wall enhancement and progressive right upper quadrant and pericholecystic fluid. Cannot rule out acute cholecystitis. 3. No convincing evidence for choledocholithiasis. No evidence for CBD or main pancreatic duct dilatation. 4. Small to moderate bilateral pleural effusions. Electronically Signed   By: Kerby Moors M.D.   On: 10/06/2019  10:57   US Abdomen Limited  Result Date: 10/05/2019 CLINICAL DATA:  Possible cholecystitis on CT. EXAM: ULTRASOUND ABDOMEN LIMITED RIGHT UPPER QUADRANT COMPARISON:  CT abdomen and pelvis 10/05/2019 FINDINGS: Gallbladder: Multiple gallstones measuring up to 1.5 cm. Prominent gallbladder wall thickening up to 1.3 cm with pericholecystic fluid. No sonographic Murphy sign noted by sonographer. Common bile duct: Diameter: 5 mm Liver: Diffusely increased parenchymal echogenicity without focal lesion identified. Portal vein is patent on color Doppler imaging with normal direction of blood flow towards the liver. Other: None. IMPRESSION: 1. Distended gallbladder with gallstones, prominent gallbladder wall thickening, and pericholecystic fluid. This is concerning for acute cholecystitis in the appropriate clinical setting, although note that the sonographic Percell Miller sign was negative. 2. Echogenic liver which may reflect steatosis. Electronically Signed   By: Logan Bores M.D.   On: 10/05/2019 17:02   DG Chest Port 1 View  Result Date: 10/05/2019 CLINICAL DATA:  Fever EXAM: PORTABLE CHEST 1 VIEW COMPARISON:  10/05/2017 FINDINGS: Chronically elevated right hemidiaphragm with right lower lobe atelectasis unchanged. Left lung is clear. Negative for heart failure infiltrate or effusion. No interval change. IMPRESSION: No active disease. Electronically Signed   By: Franchot Gallo M.D.   On: 10/05/2019 12:09   MR ABDOMEN MRCP W WO CONTAST  Result Date: 10/06/2019 CLINICAL DATA:  Abdominal pain. Suspect cholecystitis. EXAM: MRI ABDOMEN WITHOUT AND WITH CONTRAST (INCLUDING MRCP) TECHNIQUE: Multiplanar multisequence MR imaging of the abdomen was performed both before and after the administration of intravenous contrast. Heavily T2-weighted images of the biliary and pancreatic ducts were obtained, and three-dimensional MRCP images were rendered by post processing. CONTRAST:  43mL GADAVIST GADOBUTROL 1 MMOL/ML IV SOLN  COMPARISON:  CT AP 10/05/2019 FINDINGS: Lower chest: Small to moderate bilateral pleural effusions. Hepatobiliary: 5 mm cyst identified within the anterior right lobe of liver. Postcontrast imaging is limited due to excessive motion artifact. Multiple stones are identified within the gallbladder which measure up to 9 mm. Gallbladder wall enhancement and mild edema noted. Right upper quadrant pericholecystic fluid is identified. The common bile duct is nondilated measuring 4 mm. No convincing evidence for choledocholithiasis. Pancreas: Evaluation of the pancreas is significantly diminished due to motion artifact. No findings to suggest main duct dilatation, inflammation or mass. Spleen:  Within normal limits in size and appearance. Adrenals/Urinary Tract: No masses identified. No evidence of hydronephrosis. Stomach/Bowel: Stomach appears nondistended. No dilated loops of bowel identified within the upper abdomen. Vascular/Lymphatic: No pathologically enlarged lymph nodes identified. No abdominal aortic aneurysm demonstrated. Other: Right upper quadrant free fluid is identified and appears increased from the previous exam. Musculoskeletal: T12 compression deformity is again noted. IMPRESSION: 1. Examination is significantly diminished due to motion artifact. 2. There are multiple gallstones identified within the gallbladder. There is gallbladder wall enhancement and progressive right upper quadrant and pericholecystic fluid. Cannot rule out acute cholecystitis. 3. No convincing evidence for choledocholithiasis. No evidence for CBD or main pancreatic duct dilatation. 4. Small to moderate bilateral pleural effusions. Electronically Signed   By: Kerby Moors M.D.   On: 10/06/2019 10:57     Anti-infectives:   Anti-infectives (From admission, onward)   Start  Dose/Rate Route Frequency Ordered Stop   10/06/19 1600  vancomycin (VANCOREADY) IVPB 750 mg/150 mL     750 mg 150 mL/hr over 60 Minutes Intravenous  Every 24 hours 10/05/19 2001     10/06/19 0200  ceFEPIme (MAXIPIME) 2 g in sodium chloride 0.9 % 100 mL IVPB     2 g 200 mL/hr over 30 Minutes Intravenous Every 12 hours 10/05/19 2001     10/05/19 2300  metroNIDAZOLE (FLAGYL) IVPB 500 mg     500 mg 100 mL/hr over 60 Minutes Intravenous Every 8 hours 10/05/19 1940     10/05/19 2015  vancomycin (VANCOCIN) IVPB 1000 mg/200 mL premix  Status:  Discontinued     1,000 mg 200 mL/hr over 60 Minutes Intravenous  Once 10/05/19 1940 10/05/19 1944   10/05/19 1945  ceFEPIme (MAXIPIME) 2 g in sodium chloride 0.9 % 100 mL IVPB  Status:  Discontinued     2 g 200 mL/hr over 30 Minutes Intravenous  Once 10/05/19 1940 10/05/19 1945   10/05/19 1500  vancomycin (VANCOREADY) IVPB 1250 mg/250 mL     1,250 mg 166.7 mL/hr over 90 Minutes Intravenous  Once 10/05/19 1359 10/05/19 1949   10/05/19 1345  ceFEPIme (MAXIPIME) 2 g in sodium chloride 0.9 % 100 mL IVPB     2 g 200 mL/hr over 30 Minutes Intravenous  Once 10/05/19 1336 10/05/19 1533   10/05/19 1345  metroNIDAZOLE (FLAGYL) IVPB 500 mg     500 mg 100 mL/hr over 60 Minutes Intravenous  Once 10/05/19 1336 10/05/19 1636   10/05/19 1345  vancomycin (VANCOCIN) IVPB 1000 mg/200 mL premix  Status:  Discontinued     1,000 mg 200 mL/hr over 60 Minutes Intravenous  Once 10/05/19 1336 10/05/19 1359      Ovidio Kin, MD, [facility] Office: [phone] 10/06/2019

## 2019-10-06 NOTE — ED Notes (Signed)
ED TO INPATIENT HANDOFF REPORT  ED Nurse Name and Phone #: Henriette Combs [phone]  S Name/Age/Gender Danielle Myers 86 y.o. female Room/Bed: WA25/WA25  Code Status   Code Status: DNR  Home/SNF/Other Home Patient oriented to: self Is this baseline? Yes   Triage Complete: Triage complete  Chief Complaint Sepsis [facility]) [A41.9]  Triage Note Patient arrived by EMS from home. Patient has weakness, cough, diarrhea, and nausea x 2 days.   Patient has an 40 gauage IV placed in LFT wrist per EMS.   1000 mg of Tylenol given and 500 ml NaCL bolus given.   Patient is confused at baseline.     Allergies No Known Allergies  Level of Care/Admitting Diagnosis ED Disposition    ED Disposition Condition Comment   Admit  Hospital Area: [facility] [[id number]]  Level of Care: Telemetry [5]  Admit to tele based on following criteria: Other see comments  Comments: sepsis  Covid Evaluation: Confirmed COVID Negative  Diagnosis: Sepsis [facility]) [[id number]]  Admitting Physician: Albertine Grates [[id number]]  Attending Physician: Albertine Grates [[id number]]  Estimated length of stay: past midnight tomorrow  Certification:: I certify this patient will need inpatient services for at least 2 midnights       B Medical/Surgery History Past Medical History:  Diagnosis Date  . Arthritis    History reviewed. No pertinent surgical history.   A IV Location/Drains/Wounds Patient Lines/Drains/Airways Status   Active Line/Drains/Airways    Name:   Placement date:   Placement time:   Site:   Days:   Peripheral IV 02/09/15 Right Forearm   02/09/15    1400    Forearm   1700   Peripheral IV 10/05/19 Right Forearm   10/05/19    1144    Forearm   1          Intake/Output Last 24 hours  Intake/Output Summary (Last 24 hours) at 10/06/2019 1718 Last data filed at 10/06/2019 1610 Gross per 24 hour  Intake 4569.39 ml  Output 200 ml  Net 4369.39 ml    Labs/Imaging Results for orders placed or  performed during the hospital encounter of 10/05/19 (from the past 48 hour(s))  CBC with Differential/Platelet     Status: Abnormal   Collection Time: 10/05/19 11:23 AM  Result Value Ref Range   WBC 22.5 (H) 4.0 - 10.5 K/uL   RBC 4.79 3.87 - 5.11 MIL/uL   Hemoglobin 14.1 12.0 - 15.0 g/dL   HCT 81.0 17.5 - 10.2 %   MCV 90.0 80.0 - 100.0 fL   MCH 29.4 26.0 - 34.0 pg   MCHC 32.7 30.0 - 36.0 g/dL   RDW 58.5 27.7 - 82.4 %   Platelets 140 (L) 150 - 400 K/uL   nRBC 0.0 0.0 - 0.2 %   Neutrophils Relative % 93 %   Neutro Abs 20.7 (H) 1.7 - 7.7 K/uL   Lymphocytes Relative 1 %   Lymphs Abs 0.3 (L) 0.7 - 4.0 K/uL   Monocytes Relative 5 %   Monocytes Absolute 1.2 (H) 0.1 - 1.0 K/uL   Eosinophils Relative 0 %   Eosinophils Absolute 0.0 0.0 - 0.5 K/uL   Basophils Relative 0 %   Basophils Absolute 0.1 0.0 - 0.1 K/uL   Immature Granulocytes 1 %   Abs Immature Granulocytes 0.20 (H) 0.00 - 0.07 K/uL    Comment: Performed at [facility], [address], [place], [place] [address]  Comprehensive metabolic panel     Status: Abnormal  Collection Time: 10/05/19 11:23 AM  Result Value Ref Range   Sodium 144 135 - 145 mmol/L   Potassium 2.8 (L) 3.5 - 5.1 mmol/L   Chloride 110 98 - 111 mmol/L   CO2 21 (L) 22 - 32 mmol/L   Glucose, Bld 139 (H) 70 - 99 mg/dL    Comment: Glucose reference range applies only to samples taken after fasting for at least 8 hours.   BUN 12 8 - 23 mg/dL   Creatinine, Ser 1.610.80 0.44 - 1.00 mg/dL   Calcium 9.1 8.9 - 09.610.3 mg/dL   Total Protein 6.6 6.5 - 8.1 g/dL   Albumin 3.2 (L) 3.5 - 5.0 g/dL   AST 045207 (H) 15 - 41 U/L   ALT 154 (H) 0 - 44 U/L   Alkaline Phosphatase 196 (H) 38 - 126 U/L   Total Bilirubin 4.9 (H) 0.3 - 1.2 mg/dL   GFR calc non Af Amer >60 >60 mL/min   GFR calc Af Amer >60 >60 mL/min   Anion gap 13 5 - 15    Comment: Performed at [facility], [address] [place], [place] [address]  Brain natriuretic peptide      Status: Abnormal   Collection Time: 10/05/19 11:23 AM  Result Value Ref Range   B Natriuretic Peptide 260.6 (H) 0.0 - 100.0 pg/mL    Comment: Performed at [facility], [address] [place], [place] [address]  Urinalysis, Routine w reflex microscopic     Status: Abnormal   Collection Time: 10/05/19 11:23 AM  Result Value Ref Range   Color, Urine AMBER (A) YELLOW    Comment: BIOCHEMICALS MAY BE AFFECTED BY COLOR   APPearance CLOUDY (A) CLEAR   Specific Gravity, Urine >1.046 (H) 1.005 - 1.030   pH 6.0 5.0 - 8.0   Glucose, UA NEGATIVE NEGATIVE mg/dL   Hgb urine dipstick NEGATIVE NEGATIVE   Bilirubin Urine SMALL (A) NEGATIVE   Ketones, ur 5 (A) NEGATIVE mg/dL   Protein, ur 829100 (A) NEGATIVE mg/dL   Nitrite NEGATIVE NEGATIVE   Leukocytes,Ua LARGE (A) NEGATIVE   RBC / HPF 6-10 0 - 5 RBC/hpf   WBC, UA >50 (H) 0 - 5 WBC/hpf   Bacteria, UA MANY (A) NONE SEEN   WBC Clumps PRESENT    Mucus PRESENT    Non Squamous Epithelial 6-10 (A) NONE SEEN    Comment: Performed at [facility], [address] [place], [place] [address]  SARS Coronavirus 2 by RT PCR (hospital order, performed in [facility] hospital lab) Nasopharyngeal Nasopharyngeal Swab     Status: None   Collection Time: 10/05/19 11:23 AM   Specimen: Nasopharyngeal Swab  Result Value Ref Range   SARS Coronavirus 2 NEGATIVE NEGATIVE    Comment: (NOTE) SARS-CoV-2 target nucleic acids are NOT DETECTED. The SARS-CoV-2 RNA is generally detectable in upper and lower respiratory specimens during the acute phase of infection. The lowest concentration of SARS-CoV-2 viral copies this assay can detect is 250 copies / mL. A negative result does not preclude SARS-CoV-2 infection and should not be used as the sole basis for treatment or other patient management decisions.  A negative result may occur with improper specimen collection / handling, submission of specimen other than nasopharyngeal swab,  presence of viral mutation(s) within the areas targeted by this assay, and inadequate number of viral copies (<250 copies / mL). A negative result must be combined with clinical observations, patient history, and epidemiological information. Fact  Sheet for Patients:   StrictlyIdeas.no Fact Sheet for Healthcare Providers: BankingDealers.co.za This test is not yet approved or cleared  by the [place] FDA and has been authorized for detection and/or diagnosis of SARS-CoV-2 by FDA under an Emergency Use Authorization (EUA).  This EUA will remain in effect (meaning this test can be used) for the duration of the COVID-19 declaration under Section 564(b)(1) of the Act, 21 U.S.C. section 360bbb-3(b)(1), unless the authorization is terminated or revoked sooner. Performed at [facility], [place] [address] [place], [place] [address]   Lipase, blood     Status: None   Collection Time: 10/05/19 11:23 AM  Result Value Ref Range   Lipase 22 11 - 51 U/L    Comment: Performed at [facility], [place] [address], [place], [place] [address]  Culture, blood (Routine X 2) w Reflex to ID Panel     Status: None (Preliminary result)   Collection Time: 10/05/19  1:31 PM   Specimen: BLOOD RIGHT ARM  Result Value Ref Range   Specimen Description      BLOOD RIGHT ARM Performed at [place] [address] [place], [place] [address]    Special Requests      BOTTLES DRAWN AEROBIC AND ANAEROBIC Blood Culture adequate volume Performed at [place] [address], [place], [place] [address]    Culture      NO GROWTH < 24 HOURS Performed at [place] [address] [place], [place] [address]    Report Status PENDING   Lactic acid, plasma     Status: Abnormal   Collection Time: 10/05/19  1:59 PM  Result Value Ref Range   Lactic Acid, Venous 4.0 (HH) 0.5 - 1.9 mmol/L     Comment: CRITICAL RESULT CALLED TO, READ BACK BY AND VERIFIED WITH: Welton Flakes RN [id number] 10/05/19 JM Performed at [facility], [place] [address], [place], [place] [address]   Culture, blood (Routine X 2) w Reflex to ID Panel     Status: None (Preliminary result)   Collection Time: 10/05/19  2:28 PM   Specimen: BLOOD  Result Value Ref Range   Specimen Description      BLOOD RIGHT ANTECUBITAL Performed at [facility], [place] [address] [place], [place] [address]    Special Requests      BOTTLES DRAWN AEROBIC AND ANAEROBIC Blood Culture adequate volume Performed at [place] [address], [place], [place] [address]    Culture      NO GROWTH < 24 HOURS Performed at [place] [address] [place], [place] [address]    Report Status PENDING   Protime-INR     Status: Abnormal   Collection Time: 10/05/19  3:14 PM  Result Value Ref Range   Prothrombin Time 16.6 (H) 11.4 - 15.2 seconds   INR 1.4 (H) 0.8 - 1.2    Comment: (NOTE) INR goal varies based on device and disease states. Performed at [facility], [place] [address], [place], [place] [address]   APTT     Status: None   Collection Time: 10/05/19  3:14 PM  Result Value Ref Range   aPTT 29 24 - 36 seconds    Comment: Performed at [facility], [place] [address], [place], [place] [address]  Lactic acid, plasma     Status: Abnormal   Collection Time: 10/05/19  3:15 PM  Result  Value Ref Range   Lactic Acid, Venous 3.5 (HH) 0.5 - 1.9 mmol/L    Comment: CRITICAL VALUE NOTED.  VALUE IS CONSISTENT WITH PREVIOUSLY REPORTED AND CALLED VALUE. Performed at [facility], [address], [place], [place] [address]   Lactic acid, plasma     Status: Abnormal   Collection Time: 10/05/19  5:15 PM  Result Value Ref Range   Lactic Acid, Venous 2.7 (HH) 0.5 - 1.9 mmol/L    Comment: CRITICAL VALUE NOTED.  VALUE IS CONSISTENT WITH PREVIOUSLY  REPORTED AND CALLED VALUE. Performed at [facility], [address] [place], [place] [address]   Lactic acid, plasma     Status: Abnormal   Collection Time: 10/05/19  7:37 PM  Result Value Ref Range   Lactic Acid, Venous 2.5 (HH) 0.5 - 1.9 mmol/L    Comment: CRITICAL VALUE NOTED.  VALUE IS CONSISTENT WITH PREVIOUSLY REPORTED AND CALLED VALUE. Performed at [facility], [address], [place], [place] [address]   CBC     Status: Abnormal   Collection Time: 10/05/19  7:41 PM  Result Value Ref Range   WBC 22.6 (H) 4.0 - 10.5 K/uL   RBC 4.18 3.87 - 5.11 MIL/uL   Hemoglobin 12.4 12.0 - 15.0 g/dL   HCT 62.6 94.8 - 54.6 %   MCV 94.0 80.0 - 100.0 fL   MCH 29.7 26.0 - 34.0 pg   MCHC 31.6 30.0 - 36.0 g/dL   RDW 27.0 35.0 - 09.3 %   Platelets 124 (L) 150 - 400 K/uL    Comment: Immature Platelet Fraction may be clinically indicated, consider ordering this additional test [id number]    nRBC 0.0 0.0 - 0.2 %    Comment: Performed at [facility], [address], [place], [place] [address]  Creatinine, serum     Status: None   Collection Time: 10/05/19  7:41 PM  Result Value Ref Range   Creatinine, Ser 0.59 0.44 - 1.00 mg/dL   GFR calc non Af Amer >60 >60 mL/min   GFR calc Af Amer >60 >60 mL/min    Comment: Performed at [facility], [address], [place], [place] [address]  TSH     Status: None   Collection Time: 10/05/19  7:45 PM  Result Value Ref Range   TSH 1.372 0.350 - 4.500 uIU/mL    Comment: Performed by a 3rd Generation assay with a functional sensitivity of <=0.01 uIU/mL. Performed at [facility], [address], [place], [place] [address]   Lactic acid, plasma     Status: None   Collection Time: 10/06/19  4:40 AM  Result Value Ref Range   Lactic Acid, Venous 1.4 0.5 - 1.9 mmol/L    Comment: Performed at [facility], [address] [place], [place] [address]   Comprehensive metabolic panel     Status: Abnormal   Collection Time: 10/06/19  4:40 AM  Result Value Ref Range   Sodium 140 135 - 145 mmol/L   Potassium 4.5 3.5 - 5.1 mmol/L    Comment: DELTA CHECK NOTED NO VISIBLE HEMOLYSIS    Chloride 118 (H) 98 - 111 mmol/L   CO2 19 (L) 22 - 32 mmol/L   Glucose, Bld 88 70 - 99 mg/dL    Comment: Glucose reference range applies only to samples taken after fasting for at least 8 hours.   BUN 13 8 - 23 mg/dL   Creatinine, Ser  0.52 0.44 - 1.00 mg/dL   Calcium 7.6 (L) 8.9 - 10.3 mg/dL   Total Protein 4.9 (L) 6.5 - 8.1 g/dL   Albumin 2.5 (L) 3.5 - 5.0 g/dL   AST 96 (H) 15 - 41 U/L   ALT 99 (H) 0 - 44 U/L   Alkaline Phosphatase 134 (H) 38 - 126 U/L   Total Bilirubin 3.2 (H) 0.3 - 1.2 mg/dL   GFR calc non Af Amer >60 >60 mL/min   GFR calc Af Amer >60 >60 mL/min   Anion gap 3 (L) 5 - 15    Comment: Performed at [facility], [address] [place], [place] [address]  CBC     Status: Abnormal   Collection Time: 10/06/19  4:40 AM  Result Value Ref Range   WBC 18.3 (H) 4.0 - 10.5 K/uL   RBC 3.87 3.87 - 5.11 MIL/uL   Hemoglobin 11.4 (L) 12.0 - 15.0 g/dL   HCT 91.4 78.2 - 95.6 %   MCV 93.3 80.0 - 100.0 fL   MCH 29.5 26.0 - 34.0 pg   MCHC 31.6 30.0 - 36.0 g/dL   RDW 21.3 08.6 - 57.8 %   Platelets 120 (L) 150 - 400 K/uL    Comment: SPECIMEN CHECKED FOR CLOTS Immature Platelet Fraction may be clinically indicated, consider ordering this additional test [id number] PLATELET COUNT CONFIRMED BY SMEAR REPEATED TO VERIFY    nRBC 0.0 0.0 - 0.2 %    Comment: Performed at [facility], [address], [place], [place] [address]  CBG monitoring, ED     Status: None   Collection Time: 10/06/19  8:50 AM  Result Value Ref Range   Glucose-Capillary 73 70 - 99 mg/dL    Comment: Glucose reference range applies only to samples taken after fasting for at least 8 hours.   CT Head Wo Contrast  Result Date:  10/05/2019 CLINICAL DATA:  Altered mental status EXAM: CT HEAD WITHOUT CONTRAST TECHNIQUE: Contiguous axial images were obtained from the base of the skull through the vertex without intravenous contrast. COMPARISON:  10/05/2017 FINDINGS: Brain: There is no acute intracranial hemorrhage, mass effect, or edema. Gray-white differentiation is preserved. There is no extra-axial fluid collection. Prominence of the ventricles and sulci reflects generalized parenchymal volume loss. Patchy and confluent areas of hypoattenuation in the supratentorial white matter are nonspecific but probably reflect moderate chronic microvascular ischemic changes. Vascular: There is atherosclerotic calcification at the skull base. Skull: Calvarium is unremarkable. Sinuses/Orbits: Mild mucosal thickening. Orbits are unremarkable. Other: Mastoid air cells are clear. IMPRESSION: No acute intracranial hemorrhage, mass effect, or evidence of acute infarction. Moderate chronic microvascular ischemic changes. Electronically Signed   By: Guadlupe Spanish M.D.   On: 10/05/2019 15:01   NM Hepatobiliary Liver Func  Result Date: 10/06/2019 CLINICAL DATA:  Evaluate for cholecystitis. Altered mental status, vomiting, sepsis and gallstones EXAM: NUCLEAR MEDICINE HEPATOBILIARY IMAGING TECHNIQUE: Sequential images of the abdomen were obtained out to 60 minutes following intravenous administration of radiopharmaceutical. RADIOPHARMACEUTICALS:  7.5 mCi Tc-88m  Choletec IV COMPARISON:  MRI abdomen 08/06/2019 and abdominal sonogram 10/05/2019. FINDINGS: Prompt radiotracer uptake by the liver is seen. During the first hour of imaging there is faint activity noted within the common bile duct. No gallbladder or bowel activity noted during the first hour. During the second hour there is progressive radiotracer accumulation within the common bile duct and bowel loops. No gallbladder activity identified however. IMPRESSION: 1. Nonvisualization of the gallbladder  after 2 hours  of imaging compatible with cystic duct obstruction due to cholecystitis. 2. Delayed biliary accumulation which may reflect underlying cholestasis. Electronically Signed   By: Signa Kell M.D.   On: 10/06/2019 13:17   US Pelvis Complete  Result Date: 10/05/2019 CLINICAL DATA:  Mass on CT imaging EXAM: TRANSABDOMINAL ULTRASOUND OF PELVIS TECHNIQUE: Transabdominal ultrasound examination of the pelvis was performed including evaluation of the uterus, ovaries, adnexal regions, and pelvic cul-de-sac. COMPARISON:  CT abdomen pelvis 09/05/2019 FINDINGS: Uterus Measurements: 5.6 x 2.9 x 4.0 cm = volume: 33 mL. Atrophic appearance, appropriate for age. No fibroid or mass visualized. Endometrium Thickness: 4 mm, non thickened.  Atrophic without focal abnormality. Right ovary Measurements: 2.8 x 1.5 x 1.7 cm = volume: 3.9 mL. Small 1.6 x 0.9 x 1.3 cm anechoic cystic structure possibly exophytic from or arising adjacent to the right ovary. Left ovary Measurements: 7.6 x 7.2 x 8 cm = volume: 228 mL. Possibly erroneously measurement given unclear margins of the normal left ovarian parenchyma alongside the larger anechoic cystic structures in the left adnexa. The largest measures 6.5 x 5.3 x 6.7 cm corresponding well to larger lesion seen on CT. A smaller more tubular cystic area is seen posterior measuring 4.4 x 2.6 x 5.5 cm, possibly reflecting a dilated tube/hydrosalpinx. Other findings: Small volume of free fluid is noted in the posterior cul-de-sac IMPRESSION: Corresponding to the abnormality on CT imaging is a large 6.5 x 5.3 x 6.7 cm cyst without significant internal complexity. More posteriorly an elongated 4.4 x 2.6 x 5.5 cm anechoic structure possibly reflects a dilated tube/hydrosalpinx. Additional 1.6 x 0.9 x 1.3 cm ovarian versus paraovarian cyst in the right adnexa. While these do not demonstrate significant internal complexity or typically concerning features, size criteria warrants at minimum  six-month surveillance ultrasound to assess for interval growth. Furthermore consider gynecologic consultation if not previously obtained. This recommendation follows consensus guidelines: Simple Adnexal Cysts: SRU Consensus Conference Update on Follow-up and Reporting. Radiology 2019; [id number]. Electronically Signed   By: Kreg Shropshire M.D.   On: 10/05/2019 17:06   CT Abdomen Pelvis W Contrast  Result Date: 10/05/2019 CLINICAL DATA:  86 year old with nausea and vomiting. Suspected diverticulitis. Abnormal LFTs. EXAM: CT ABDOMEN AND PELVIS WITH CONTRAST TECHNIQUE: Multidetector CT imaging of the abdomen and pelvis was performed using the standard protocol following bolus administration of intravenous contrast. CONTRAST:  47mL OMNIPAQUE IOHEXOL 300 MG/ML  SOLN COMPARISON:  None. FINDINGS: Lower chest: Minimal pleural fluid or thickening in the posterior right chest. Minimal left posterior pleural thickening. Eccentric soft tissue adjacent to the distal right pulmonary artery on sequence 2 image 1. This appears to be causing narrowing or compression of the distal right pulmonary artery. Atherosclerotic calcifications in the visualized thoracic aorta. Patchy densities at the lung bases could represent atelectasis or areas of scarring. Hepatobiliary: Gallbladder is mildly distended. There may be a small amount of fluid adjacent to the gallbladder. No significant intrahepatic or extrahepatic biliary dilatation. Main portal venous system is patent. Pancreas: Unremarkable. No pancreatic ductal dilatation or surrounding inflammatory changes. Spleen: Normal in size without focal abnormality. Adrenals/Urinary Tract: No gross abnormality to the adrenal glands. Normal appearance of both kidneys. No appearance of the urinary bladder. Small low-density structure in the posterior left kidney is too small to definitively characterize but could represent a small cyst. No hydronephrosis. Stomach/Bowel: Normal appearance of the  stomach and duodenum. Rectum is distended with a large amount of stool. Wall thickening involving the distal sigmoid colon adjacent to  the rectal stool. Diverticula involving the sigmoid colon. No clear evidence for acute bowel inflammation. No evidence for a bowel obstruction. Vascular/Lymphatic: Atherosclerotic calcifications in the abdominal aorta without aneurysm. Main visceral arteries are patent. Venous structures are unremarkable. No evidence for abdominopelvic lymphadenopathy. Reproductive: Uterus appears to be present. There is a large low-density structure in the left hemipelvis that measures 6.5 x 5.8 x 6.1 cm. There is an additional low-density collection posterior to the larger lesion that measures 3.8 x 2.5 x 4.7 cm. Findings are concerning for left ovarian/adnexal cystic lesions. Other: There may be a small amount of fluid around the gallbladder. Otherwise, no significant ascites. Negative for free air. Musculoskeletal: Mild anterolisthesis at L4-L5 secondary to facet arthropathy. Vertebral body compression deformity and fracture at T12 and likely new since the chest radiograph on 10/05/2017. IMPRESSION: 1. Low density lesions in the left hemipelvis, largest measuring up to 6.5 cm. Findings are concerning for left ovarian/adnexal cystic lesions. Recommend further characterization with pelvic ultrasound. 2. Gallbladder is mildly distended. Cannot exclude a small amount of pericholecystic fluid. Elevated bilirubin and abnormal LFTs. Findings raise concern for underlying gallbladder inflammation and recommend further characterization with right upper quadrant ultrasound. 3. Soft tissue attenuation in the region of the distal right pulmonary artery. This area is incompletely evaluated. Findings could be associated with chronic pulmonary artery thrombus or adjacent soft tissue. Findings are equivocal on this abdominal CT. This may be better characterized with CTA of the chest using with pulmonary embolism  protocol. 4. Diverticulosis involving the sigmoid colon but limited evaluation of sigmoid colon due to the large low-density pelvic collections. Rectum is distended with large amount of stool. 5. T12 compression fracture of uncertain age. These results were called by telephone at the time of interpretation on 10/05/2019 at 3:26 pm to provider Vanetta Mulders , who verbally acknowledged these results. Electronically Signed   By: Richarda Overlie M.D.   On: 10/05/2019 15:27   MR 3D Recon At Scanner  Result Date: 10/06/2019 CLINICAL DATA:  Abdominal pain. Suspect cholecystitis. EXAM: MRI ABDOMEN WITHOUT AND WITH CONTRAST (INCLUDING MRCP) TECHNIQUE: Multiplanar multisequence MR imaging of the abdomen was performed both before and after the administration of intravenous contrast. Heavily T2-weighted images of the biliary and pancreatic ducts were obtained, and three-dimensional MRCP images were rendered by post processing. CONTRAST:  6mL GADAVIST GADOBUTROL 1 MMOL/ML IV SOLN COMPARISON:  CT AP 10/05/2019 FINDINGS: Lower chest: Small to moderate bilateral pleural effusions. Hepatobiliary: 5 mm cyst identified within the anterior right lobe of liver. Postcontrast imaging is limited due to excessive motion artifact. Multiple stones are identified within the gallbladder which measure up to 9 mm. Gallbladder wall enhancement and mild edema noted. Right upper quadrant pericholecystic fluid is identified. The common bile duct is nondilated measuring 4 mm. No convincing evidence for choledocholithiasis. Pancreas: Evaluation of the pancreas is significantly diminished due to motion artifact. No findings to suggest main duct dilatation, inflammation or mass. Spleen:  Within normal limits in size and appearance. Adrenals/Urinary Tract: No masses identified. No evidence of hydronephrosis. Stomach/Bowel: Stomach appears nondistended. No dilated loops of bowel identified within the upper abdomen. Vascular/Lymphatic: No pathologically  enlarged lymph nodes identified. No abdominal aortic aneurysm demonstrated. Other: Right upper quadrant free fluid is identified and appears increased from the previous exam. Musculoskeletal: T12 compression deformity is again noted. IMPRESSION: 1. Examination is significantly diminished due to motion artifact. 2. There are multiple gallstones identified within the gallbladder. There is gallbladder wall enhancement and progressive right upper  quadrant and pericholecystic fluid. Cannot rule out acute cholecystitis. 3. No convincing evidence for choledocholithiasis. No evidence for CBD or main pancreatic duct dilatation. 4. Small to moderate bilateral pleural effusions. Electronically Signed   By: Signa Kell M.D.   On: 10/06/2019 10:57   US Abdomen Limited  Result Date: 10/05/2019 CLINICAL DATA:  Possible cholecystitis on CT. EXAM: ULTRASOUND ABDOMEN LIMITED RIGHT UPPER QUADRANT COMPARISON:  CT abdomen and pelvis 10/05/2019 FINDINGS: Gallbladder: Multiple gallstones measuring up to 1.5 cm. Prominent gallbladder wall thickening up to 1.3 cm with pericholecystic fluid. No sonographic Murphy sign noted by sonographer. Common bile duct: Diameter: 5 mm Liver: Diffusely increased parenchymal echogenicity without focal lesion identified. Portal vein is patent on color Doppler imaging with normal direction of blood flow towards the liver. Other: None. IMPRESSION: 1. Distended gallbladder with gallstones, prominent gallbladder wall thickening, and pericholecystic fluid. This is concerning for acute cholecystitis in the appropriate clinical setting, although note that the sonographic Eulah Pont sign was negative. 2. Echogenic liver which may reflect steatosis. Electronically Signed   By: Sebastian Ache M.D.   On: 10/05/2019 17:02   DG Chest Port 1 View  Result Date: 10/05/2019 CLINICAL DATA:  Fever EXAM: PORTABLE CHEST 1 VIEW COMPARISON:  10/05/2017 FINDINGS: Chronically elevated right hemidiaphragm with right lower lobe  atelectasis unchanged. Left lung is clear. Negative for heart failure infiltrate or effusion. No interval change. IMPRESSION: No active disease. Electronically Signed   By: Marlan Palau M.D.   On: 10/05/2019 12:09   MR ABDOMEN MRCP W WO CONTAST  Result Date: 10/06/2019 CLINICAL DATA:  Abdominal pain. Suspect cholecystitis. EXAM: MRI ABDOMEN WITHOUT AND WITH CONTRAST (INCLUDING MRCP) TECHNIQUE: Multiplanar multisequence MR imaging of the abdomen was performed both before and after the administration of intravenous contrast. Heavily T2-weighted images of the biliary and pancreatic ducts were obtained, and three-dimensional MRCP images were rendered by post processing. CONTRAST:  6mL GADAVIST GADOBUTROL 1 MMOL/ML IV SOLN COMPARISON:  CT AP 10/05/2019 FINDINGS: Lower chest: Small to moderate bilateral pleural effusions. Hepatobiliary: 5 mm cyst identified within the anterior right lobe of liver. Postcontrast imaging is limited due to excessive motion artifact. Multiple stones are identified within the gallbladder which measure up to 9 mm. Gallbladder wall enhancement and mild edema noted. Right upper quadrant pericholecystic fluid is identified. The common bile duct is nondilated measuring 4 mm. No convincing evidence for choledocholithiasis. Pancreas: Evaluation of the pancreas is significantly diminished due to motion artifact. No findings to suggest main duct dilatation, inflammation or mass. Spleen:  Within normal limits in size and appearance. Adrenals/Urinary Tract: No masses identified. No evidence of hydronephrosis. Stomach/Bowel: Stomach appears nondistended. No dilated loops of bowel identified within the upper abdomen. Vascular/Lymphatic: No pathologically enlarged lymph nodes identified. No abdominal aortic aneurysm demonstrated. Other: Right upper quadrant free fluid is identified and appears increased from the previous exam. Musculoskeletal: T12 compression deformity is again noted. IMPRESSION: 1.  Examination is significantly diminished due to motion artifact. 2. There are multiple gallstones identified within the gallbladder. There is gallbladder wall enhancement and progressive right upper quadrant and pericholecystic fluid. Cannot rule out acute cholecystitis. 3. No convincing evidence for choledocholithiasis. No evidence for CBD or main pancreatic duct dilatation. 4. Small to moderate bilateral pleural effusions. Electronically Signed   By: Signa Kell M.D.   On: 10/06/2019 10:57    Pending Labs Unresulted Labs (From admission, onward)   None      Vitals/Pain Today's Vitals   10/06/19 1606 10/06/19 1615 10/06/19 1630  10/06/19 1700  BP: (!) 164/84  (!) 149/81 (!) 158/75  Pulse: 62 66 63 64  Resp: Temp: 98.5 F (36.9 C)     TempSrc: Oral     SpO2: 97% 99% 97% 97%  Weight:      Height:        Isolation Precautions Airborne and Contact precautions  Medications Medications  sodium chloride (PF) 0.9 % injection (has no administration in time range)  metroNIDAZOLE (FLAGYL) IVPB 500 mg (500 mg Intravenous New Bag/Given 10/06/19 1549)  heparin injection 5,000 Units (5,000 Units Subcutaneous Given 10/06/19 1429)  acetaminophen (TYLENOL) tablet 650 mg (has no administration in time range)    Or  acetaminophen (TYLENOL) suppository 650 mg (has no administration in time range)  morphine 2 MG/ML injection 2 mg (2 mg Intravenous Given 10/06/19 1547)  senna-docusate (Senokot-S) tablet 1 tablet (has no administration in time range)  ondansetron (ZOFRAN) tablet 4 mg (has no administration in time range)    Or  ondansetron (ZOFRAN) injection 4 mg (has no administration in time range)  albuterol (PROVENTIL) (2.5 MG/3ML) 0.083% nebulizer solution 2.5 mg (has no administration in time range)  ceFEPIme (MAXIPIME) 2 g in sodium chloride 0.9 % 100 mL IVPB (0 g Intravenous Stopped 10/06/19 1501)  vancomycin (VANCOREADY) IVPB 750 mg/150 mL (has no administration in time range)   dextrose 5 % in lactated ringers infusion ( Intravenous Not Given 10/06/19 1026)  dextrose 5 %-0.45 % sodium chloride infusion ( Intravenous Stopped 10/06/19 1549)  enalaprilat (VASOTEC) injection 0.625 mg (0.625 mg Intravenous Given 10/06/19 1615)  ceFEPIme (MAXIPIME) 2 g in sodium chloride 0.9 % 100 mL IVPB (0 g Intravenous Stopped 10/05/19 1533)  metroNIDAZOLE (FLAGYL) IVPB 500 mg (0 mg Intravenous Stopped 10/05/19 1636)  iohexol (OMNIPAQUE) 300 MG/ML solution 75 mL (75 mLs Intravenous Contrast Given 10/05/19 1406)  vancomycin (VANCOREADY) IVPB 1250 mg/250 mL (0 mg Intravenous Stopped 10/05/19 1949)  sodium chloride 0.9 % bolus 1,000 mL (0 mLs Intravenous Stopped 10/05/19 1805)    And  sodium chloride 0.9 % bolus 1,000 mL (0 mLs Intravenous Stopped 10/05/19 1805)  potassium chloride 10 mEq in 100 mL IVPB (0 mEq Intravenous Stopped 10/06/19 0049)  sodium chloride 0.9 % bolus 1,000 mL (0 mLs Intravenous Stopped 10/06/19 0050)  gadobutrol (GADAVIST) 1 MMOL/ML injection 6 mL (6 mLs Intravenous Contrast Given 10/06/19 1017)  technetium TC 53M mebrofenin (CHOLETEC) injection 7.5 millicurie (7.5 millicuries Intravenous Contrast Given 10/06/19 1105)    Mobility non-ambulatory High fall risk   Focused Assessments Neuro Assessment Handoff:  Swallow screen pass? No PT NPO Cardiac Rhythm: Normal sinus rhythm       Neuro Assessment: Exceptions to WDL Neuro Checks:        R Recommendations: See Admitting Provider Note  Report given to: Gale Journey, RN 5E   Additional Notes:

## 2019-10-06 NOTE — ED Notes (Signed)
No purple man and multiple calls from floor and [facility] about precaution status

## 2019-10-07 DIAGNOSIS — R531 Weakness: Secondary | ICD-10-CM

## 2019-10-07 DIAGNOSIS — F0390 Unspecified dementia without behavioral disturbance: Secondary | ICD-10-CM

## 2019-10-07 DIAGNOSIS — Z515 Encounter for palliative care: Secondary | ICD-10-CM

## 2019-10-07 DIAGNOSIS — Z7189 Other specified counseling: Secondary | ICD-10-CM

## 2019-10-07 LAB — COMPREHENSIVE METABOLIC PANEL
ALT: 81 U/L — ABNORMAL HIGH (ref 0–44)
AST: 48 U/L — ABNORMAL HIGH (ref 15–41)
Albumin: 3.1 g/dL — ABNORMAL LOW (ref 3.5–5.0)
Alkaline Phosphatase: 187 U/L — ABNORMAL HIGH (ref 38–126)
Anion gap: 9 (ref 5–15)
BUN: 13 mg/dL (ref 8–23)
CO2: 18 mmol/L — ABNORMAL LOW (ref 22–32)
Calcium: 9 mg/dL (ref 8.9–10.3)
Chloride: 116 mmol/L — ABNORMAL HIGH (ref 98–111)
Creatinine, Ser: 0.62 mg/dL (ref 0.44–1.00)
GFR calc Af Amer: >60 mL/min (ref >60)
GFR calc non Af Amer: >60 mL/min (ref >60)
Glucose, Bld: 84 mg/dL (ref 70–99)
Potassium: 3.6 mmol/L (ref 3.5–5.1)
Sodium: 143 mmol/L (ref 135–145)
Total Bilirubin: 2.8 mg/dL — ABNORMAL HIGH (ref 0.3–1.2)
Total Protein: 5.8 g/dL — ABNORMAL LOW (ref 6.5–8.1)

## 2019-10-07 LAB — PROTIME-INR
INR: 1.1 (ref 0.8–1.2)
Prothrombin Time: 14.2 seconds (ref 11.4–15.2)

## 2019-10-07 LAB — LACTIC ACID, PLASMA: Lactic Acid, Venous: 1.3 mmol/L (ref 0.5–1.9)

## 2019-10-07 LAB — MAGNESIUM: Magnesium: 1.8 mg/dL (ref 1.7–2.4)

## 2019-10-07 MED ORDER — ENOXAPARIN SODIUM 40 MG/0.4ML ~~LOC~~ SOLN
40.0000 mg | SUBCUTANEOUS | Status: DC
  Administered 2019-10-07 – 2019-10-10 (×4): 40 mg via SUBCUTANEOUS
  Filled 2019-10-07 (×4): qty 0.4

## 2019-10-07 MED ORDER — HALOPERIDOL LACTATE 5 MG/ML IJ SOLN
2.0000 mg | Freq: Four times a day (QID) | INTRAMUSCULAR | Status: DC | PRN
  Administered 2019-10-07: 2 mg via INTRAVENOUS

## 2019-10-07 MED ORDER — ENALAPRILAT 1.25 MG/ML IV SOLN
0.6250 mg | Freq: Three times a day (TID) | INTRAVENOUS | Status: AC
  Administered 2019-10-07 – 2019-10-08 (×3): 0.625 mg via INTRAVENOUS
  Filled 2019-10-07 (×3): qty 0.5

## 2019-10-07 NOTE — Evaluation (Signed)
Physical Therapy Evaluation Patient Details Name: Danielle Myers MRN: [id number] DOB: 12-05-1918 Today's Date: 10/07/2019   History of Present Illness  Pt admitted with AMS, weakness and sepsis 2* acute cholecysitis.  Pt wtih hx of asthma, dementia   Clinical Impression  Pt admitted as above and presenting with functional mobility limitations 2* generalized weakness, balance deficits, poor endurance and dementia related cognitive deficits.  Pt currently at home with assist of aides but unclear on hours of coverage or degree of assist provided.  Pt would benefit from follow up rehab at SNF level to maximize IND and safety unless appropriate 24/7 home assist is available     Follow Up Recommendations SNF;Home health PT;Supervision/Assistance - 24 hour(dependent on assist at home)    Equipment Recommendations  (TBD )    Recommendations for Other Services OT consult     Precautions / Restrictions Precautions Precautions: Fall Restrictions Weight Bearing Restrictions: No      Mobility  Bed Mobility Overal bed mobility: Needs Assistance Bed Mobility: Supine to Sit     Supine to sit: Mod assist     General bed mobility comments: multimodal cues to encourage pt participation; use of pad to rotate pt to EOB sitting and physical assist to bring trunk to upright and balance in sitting  Transfers Overall transfer level: Needs assistance Equipment used: Rolling walker (2 wheeled) Transfers: Sit to/from Stand Sit to Stand: Min assist         General transfer comment: assist to bring wt up and fwd and to balance in initial standing with RW  Ambulation/Gait Ambulation/Gait assistance: Min assist;+2 safety/equipment(chair follow) Gait Distance (Feet): 10 Feet Assistive device: Rolling walker (2 wheeled) Gait Pattern/deviations: Step-through pattern;Decreased step length - right;Decreased step length - left;Shuffle;Trunk flexed Gait velocity: decr   General Gait Details: cues for  posture and position from RW; distance ltd by fatigue and incontinence  Stairs            Wheelchair Mobility    Modified Rankin (Stroke Patients Only)       Balance Overall balance assessment: Needs assistance Sitting-balance support: Feet supported;Bilateral upper extremity supported Sitting balance-Leahy Scale: Fair     Standing balance support: Bilateral upper extremity supported Standing balance-Leahy Scale: Poor                               Pertinent Vitals/Pain Pain Assessment: No/denies pain    Home Living Family/patient expects to be discharged to:: Private residence Living Arrangements: Alone Available Help at Discharge: Personal care attendant Type of Home: [place]: One level   Additional Comments: Per chart, pt has home health aides providing assist but unclear of whether assist if 24/7.    Prior Function           Comments: Per chart, pt ambulated in home but unclear whether assistive device was used     Hand Dominance        Extremity/Trunk Assessment   Upper Extremity Assessment Upper Extremity Assessment: Generalized weakness;Difficult to assess due to impaired cognition(Pt with difficulty following cues - HOH vs dementia?)    Lower Extremity Assessment Lower Extremity Assessment: Generalized weakness;Difficult to assess due to impaired cognition    Cervical / Trunk Assessment Cervical / Trunk Assessment: Kyphotic  Communication   Communication: HOH  Cognition Arousal/Alertness: Awake/alert Behavior During Therapy: WFL for tasks assessed/performed Overall Cognitive Status: History of cognitive impairments - at  baseline                                 General Comments: HOH vs cognitive impairment      General Comments      Exercises     Assessment/Plan    PT Assessment Patient needs continued PT services  PT Problem List Decreased strength;Decreased activity  tolerance;Decreased balance;Decreased mobility;Decreased cognition;Decreased knowledge of use of DME;Decreased knowledge of precautions       PT Treatment Interventions DME instruction;Gait training;Functional mobility training;Therapeutic activities;Therapeutic exercise;Stair training;Balance training;Cognitive remediation;Patient/family education    PT Goals (Current goals can be found in the Care Plan section)  Acute Rehab PT Goals Patient Stated Goal: Pt agreeable to OOB PT Goal Formulation: Patient unable to participate in goal setting Time For Goal Achievement: 10/21/19 Potential to Achieve Goals: Fair    Frequency Min 3X/week   Barriers to discharge Decreased caregiver support Pt home with aides but unclear on 24/7 coverage    Co-evaluation               AM-PAC PT "6 Clicks" Mobility  Outcome Measure Help needed turning from your back to your side while in a flat bed without using bedrails?: None Help needed moving from lying on your back to sitting on the side of a flat bed without using bedrails?: A Little Help needed moving to and from a bed to a chair (including a wheelchair)?: A Little Help needed standing up from a chair using your arms (e.g., wheelchair or bedside chair)?: A Little Help needed to walk in hospital room?: A Little Help needed climbing 3-5 steps with a railing? : A Lot 6 Click Score: 18    End of Session Equipment Utilized During Treatment: Gait belt Activity Tolerance: Patient limited by fatigue Patient left: in chair;with call bell/phone within reach;with chair alarm set Nurse Communication: Mobility status PT Visit Diagnosis: Unsteadiness on feet (R26.81);Muscle weakness (generalized) (M62.81);Difficulty in walking, not elsewhere classified (R26.2)    Time: 1211-1230 PT Time Calculation (min) (ACUTE ONLY): 19 min   Charges:   PT Evaluation $PT Eval Low Complexity: 1 Low          Mauro Kaufmann PT Acute Rehabilitation  Services Pager [phone] Office [phone]   Adriena Manfre 10/07/2019, 1:09 PM

## 2019-10-07 NOTE — Consult Note (Signed)
Consultation Note Date: 10/07/2019   Patient Name: Danielle Myers  DOB: 12/22/1918  MRN: [id number]  Age / Sex: 86 y.o., female  PCP: Roderick Pee, MD (Inactive) Referring Physician: Albertine Grates, MD  Reason for Consultation: Establishing goals of care  HPI/Patient Profile: 86 y.o. female  admitted on 10/05/2019   Clinical Assessment and Goals of Care: 86 year old with medical history of hypertension dyslipidemia mild asthma and dementia.  She lives with assistance of home health aides and has a POA by the name of Casimiro Needle.  Patient brought into the hospital for increased somnolence and weakness and lethargy.  Patient worked up and found to have severe sepsis source from acute cholecystitis, general surgery has been following.  Patient placed on antibiotics.  Abdominal imaging shows multiple gallstones within the gallbladder, gallbladder wall enhancement delayed biliary accumulation reflecting cholestasis.  Remains on broad-spectrum antibiotics.  A palliative consultation has been requested for ongoing goals of care discussions and for additional supportive care.  Ms. Avary is resting in bed.  She awakens easily.  She is only able to answer a few yes/no type questions appropriately.  She denies pain.  She has baseline dementia.  She does not have agitation.  Call placed and discussed with power of attorney Casimiro Needle.  He is thankful for information he has been receiving from medical staff and for the type of care the patient is receiving.  Palliative medicine is specialized medical care for people living with serious illness. It focuses on providing relief from the symptoms and stress of a serious illness. The goal is to improve quality of life for both the patient and the family.  Goals of care: Broad aims of medical therapy in relation to the patient's values and preferences. Our aim is to provide medical care aimed  at enabling patients to achieve the goals that matter most to them, given the circumstances of their particular medical situation and their constraints.   Please note recommendations below.   NEXT OF [place] Power of attorney Reita Chard [phone]  SUMMARY OF RECOMMENDATIONS   Agree with DO NOT RESUSCITATE Continue current mode of care, discussed with POA SNF rehab with palliative following is recommended, physical therapy to see as well.  Code Status/Advance Care Planning:  DNR    Symptom Management:   As above  Palliative Prophylaxis:   Delirium Protocol  Psycho-social/Spiritual:   Desire for further Chaplaincy support:yes  Additional Recommendations: Caregiving  Support/Resources  Prognosis:   Unable to determine  Discharge Planning: Skilled Nursing Facility for rehab with Palliative care service follow-up      Primary Diagnoses: Present on Admission: . Sepsis (HCC)   I have reviewed the medical record, interviewed the patient and family, and examined the patient. The following aspects are pertinent.  Past Medical History:  Diagnosis Date  . Arthritis    Social History   Socioeconomic History  . Marital status: Widowed    Spouse name: Not on file  . Number of children: Not on file  . Years of education: Not on  file  . Highest education level: Not on file  Occupational History  . Not on file  Tobacco Use  . Smoking status: Never Smoker  . Smokeless tobacco: Never Used  Substance and Sexual Activity  . Alcohol use: Yes  . Drug use: Not on file  . Sexual activity: Not on file  Other Topics Concern  . Not on file  Social History Narrative  . Not on file   Social Determinants of Health   Financial Resource Strain:   . Difficulty of Paying Living Expenses:   Food Insecurity:   . Worried About Programme researcher, broadcasting/film/video in the Last Year:   . Barista in the Last Year:   Transportation Needs:   . Freight forwarder (Medical):   Marland Kitchen Lack  of Transportation (Non-Medical):   Physical Activity:   . Days of Exercise per Week:   . Minutes of Exercise per Session:   Stress:   . Feeling of Stress :   Social Connections:   . Frequency of Communication with Friends and Family:   . Frequency of Social Gatherings with Friends and Family:   . Attends Religious Services:   . Active Member of Clubs or Organizations:   . Attends Banker Meetings:   Marland Kitchen Marital Status:    History reviewed. No pertinent family history. Scheduled Meds: . enalaprilat  0.625 mg Intravenous Q8H  . enoxaparin (LOVENOX) injection  40 mg Subcutaneous Q24H  . haloperidol lactate  2 mg Intravenous Once   Continuous Infusions: . sodium chloride 10 mL/hr at 10/07/19 0600  . ceFEPime (MAXIPIME) IV 2 g (10/07/19 0258)  . metronidazole 500 mg (10/07/19 0646)   PRN Meds:.sodium chloride, acetaminophen **OR** acetaminophen, albuterol, morphine injection, ondansetron **OR** ondansetron (ZOFRAN) IV, senna-docusate Medications Prior to Admission:  Prior to Admission medications   Medication Sig Start Date End Date Taking? Authorizing Provider  Ascorbic Acid (VITAMIN C PO) Take 1 tablet by mouth daily.   Yes [provider]  OVER THE COUNTER MEDICATION Take 1 tablet by mouth daily as needed (immune support). Herbal product for immune support   Yes [provider]  VITAMIN D PO Take 1 tablet by mouth daily.   Yes [provider]  hydrochlorothiazide (MICROZIDE) 12.5 MG capsule TAKE ONE CAPSULE BY MOUTH EVERY DAY Patient not taking: Reported on 10/06/2019 12/06/13   Roderick Pee, MD  hydrochlorothiazide (MICROZIDE) 12.5 MG capsule TAKE 1 CAPSULE BY MOUTH EVERY DAY Patient not taking: Reported on 10/06/2019 02/14/14   Roderick Pee, MD  mometasone (NASONEX) 50 MCG/ACT nasal spray Place 2 sprays into the nose daily. Patient not taking: Reported on 10/06/2019 08/15/12   Gordy Savers, MD   No Known Allergies Review of  Systems Denies pain  Physical Exam Some what confused, but awakens easily, answers a few yes/no type questions well Regular work of breathing Does not have edema S1-S2 Abdomen does not appear distended Vital Signs: BP (!) 160/89 (BP Location: Left Arm)   Pulse 66   Temp 98.7 F (37.1 C) (Oral)   Resp 19   Ht 5' (1.524 m)   Wt 61.2 kg   SpO2 100%   BMI 26.37 kg/m  Pain Scale: PAINAD   Pain Score: 0-No pain   SpO2: SpO2: 100 % O2 Device:SpO2: 100 % O2 Flow Rate: .   IO: Intake/output summary:   Intake/Output Summary (Last 24 hours) at 10/07/2019 1150 Last data filed at 10/07/2019 0900 Gross per 24 hour  Intake 910.62 ml  Output 300 ml  Net 610.62 ml    LBM: Last BM Date: 10/06/19 Baseline Weight: Weight: 59 kg Most recent weight: Weight: 61.2 kg     Palliative Assessment/Data:   PPS 40%  Time In:  10.30 Time Out:  11.30 Time Total:   60  Greater than 50%  of this time was spent counseling and coordinating care related to the above assessment and plan.  Signed by: Loistine Chance, MD   Please contact Palliative Medicine Team phone at [phone] for questions and concerns.  For individual provider: See Shea Evans

## 2019-10-07 NOTE — Evaluation (Signed)
Clinical/Bedside Swallow Evaluation Patient Details  Name: Voula Waln MRN: [id number] Date of Birth: [birth year]  Today's Date: 10/07/2019 Time: SLP Start Time (ACUTE ONLY): 1419 SLP Stop Time (ACUTE ONLY): 1437 SLP Time Calculation (min) (ACUTE ONLY): 18 min  Past Medical History:  Past Medical History:  Diagnosis Date  . Arthritis    Past Surgical History: History reviewed. No pertinent surgical history. HPI:  Tyara Dassow is a 86 y.o. female with medical history significant of HTN, HLD, mild asthma, dementia, who lives alone with the assistance of home aides.  She presented to the ed brought in by [facility] [phone]). Please note patient is unable to give history at this time and  history is given by POA. Per POA patient was brought in to ed due to increase somnolence,weakness, uri sxs x3 days, as well as increasde stools and emesis morning of presentation.  Per POA at baseline she is oriented to person and place, however over the last 24 hours she has become more confused and lethargic. He also states that patient is very stoic and does not complain. She also dislikes doctor visits and has not followed up with her primary care as she should. She also  has not taken prescribed medication in some time. However he notes with the help of one other care giver she generally does well at home, but needs assistance with most ADLS.  She was found to have cholycystitis, UTI, abnormal LFT's and sepsis without shock.  CXR was showing no active disease.  CT of the head was showing no acute intracranial hemorrhage, mass effect or acute infarct.  Moderate chronic micro vascular ischemic changes were seen.     Assessment / Plan / Recommendation Clinical Impression  Limited clinical swallowing evaluation was completed using thin liquids only as patient is currently on clear liquid diet.  She is very hard of hearing and struggled to follow commands.  Therefore, cranial nerve exam was unable to  be completed.  Given thin liquids via straw obvious s/s of aspiration were not seen.  She did tend to take very small sips even from the straw.  Swallow trigger was appreciated to palpation.  Suggest she continue on clear liquids per medical team.  ST will follow up at her diet is advanced to re assess her swallow.   SLP Visit Diagnosis: Dysphagia, unspecified (R13.10)    Aspiration Risk  Mild aspiration risk    Diet Recommendation   clear liquids per medical team  Medication Administration: Whole meds with liquid    Other  Recommendations Oral Care Recommendations: Oral care BID   Follow up Recommendations Other (comment)(TBD)      Frequency and Duration min 2x/week  2 weeks       Prognosis Prognosis for Safe Diet Advancement: Good      Swallow Study   General Date of Onset: 79/02/40 HPI: Raisa Ditto is a 86 y.o. female with medical history significant of HTN, HLD, mild asthma, dementia, who lives alone with the assistance of home aides.  She presented to the ed brought in by [facility] [phone]). Please note patient is unable to give history at this time and  history is given by POA. Per POA patient was brought in to ed due to increase somnolence,weakness, uri sxs x3 days, as well as increasde stools and emesis morning of presentation.  Per POA at baseline she is oriented to person and place, however over the last 24 hours she has become  more confused and lethargic. He also states that patient is very stoic and does not complain. She also dislikes doctor visits and has not followed up with her primary care as she should. She also  has not taken prescribed medication in some time. However he notes with the help of one other care giver she generally does well at home, but needs assistance with most ADLS.  She was found to have cholycystitis, UTI, abnormal LFT's and sepsis without shock.  CXR was showing no active disease.  CT of the head was showing no acute intracranial  hemorrhage, mass effect or acute infarct.  Moderate chronic micro vascular ischemic changes were seen.   Type of Study: Bedside Swallow Evaluation Previous Swallow Assessment: None noted at [facility] Diet Prior to this Study: Other (Comment)(clear liquids) Temperature Spikes Noted: No Respiratory Status: Room air History of Recent Intubation: No Behavior/Cognition: Alert;Cooperative;Pleasant mood;Confused;Requires cueing;Doesn't follow directions Oral Cavity Assessment: Within Functional Limits Oral Care Completed by SLP: No Oral Cavity - Dentition: Missing dentition Vision: Functional for self-feeding Self-Feeding Abilities: Needs assist Patient Positioning: Upright in chair Baseline Vocal Quality: Normal Volitional Cough: Other (Comment)(not tested) Volitional Swallow: Unable to elicit    Oral/Motor/Sensory Function Overall Oral Motor/Sensory Function: Other (comment)(Unable to assess)   Ice Chips Ice chips: Not tested   Thin Liquid Thin Liquid: Within functional limits Presentation: Straw    Nectar Thick Nectar Thick Liquid: Not tested   Honey Thick Honey Thick Liquid: Not tested   Puree Puree: Not tested   Solid     Solid: Not tested     Dimas Aguas, MA, CCC-SLP Acute Rehab SLP [phone]  Fleet Contras 10/07/2019,2:44 PM

## 2019-10-07 NOTE — Progress Notes (Signed)
PROGRESS NOTE    Danielle Myers  ZWC:[id number] DOB: Jan 01, 1919 DOA: 10/05/2019 PCP: Dorena Cookey, MD (Inactive)    Chief Complaint  Patient presents with   Altered Mental Status   Fever    Brief Narrative:  Chief Complaint:  Change in mental status   HPI: Danielle Myers is a 86 y.o. female with medical history significant of  HTN, HLD, Mild asthma, dementia, who lives alone with the assistance of home aides who presents to ed brought in by [employer] [phone]). Please note patient is unable to give history at this time and  history is given by POA. Per POA patient was brought in to ed due to increase somnolence,weakness, uri sxs 3 days, as well as increase stools and emesis morning of presentation.  Per POA at baseline she is oriented to person and place, however over the last 24 hours she has become more confused and lethargic. He also states that patient is very stoic and does not complain. She also dislikes doctor visits and has note followed up with her primary care as she should. She also  has not taken prescribed medication in some time. However he notes with the help of one other care giver she generally does well at home, but needs assistance with most ADLS.  She is found to be in severe sepsis suspect source from acute cholecystitis, general surgery consulted  Subjective:  Lethargic, confused, hard of hearing, not able to provide history POA at bedside   Assessment & Plan:   Active Problems:   Sepsis ([place])  Sepsis present on admission with acute metabolic encephalopathy, source of infection acute cholecystitis plus minus UTI -CT head no acute findings -WBC 22.5, lactic acidosis lactic acid 4 on presentation -chest x-ray no acute findings, Blood culture obtained in the ED no growth, UA suspect for UTI, urine culture was obtained after abx treatment   Acute cholecystitis: MRCP:  There are multiple gallstones identified within the gallbladder. There is  gallbladder wall enhancement and progressive right upper quadrant and pericholecystic fluid. Cannot rule out acute cholecystitis.  No convincing evidence for choledocholithiasis. No evidence for CBD or main pancreatic duct dilatation. HIDA scan: 1. Nonvisualization of the gallbladder after 2 hours of imaging compatible with cystic duct obstruction due to cholecystitis. 2. Delayed biliary accumulation which may reflect underlying cholestasis. She started on vanc, Cefepime and Flagyl on admission, d/c vanc with negative  mrsa screening and clinical improvement Will follow general surgery recommendation   Elevated LFT, likely multifactorial including sepsis and acute cholecystitis  Mild thrombocytopenia, likely due to sepsis, monitor  Hypertension She initially presented with borderline low blood pressure , now blood pressure started to increase,   she has tendency to have bradycardia, IV hydralazine is on national shortage, started on  IV Vasotec Change to oral bp meds once able to taker oral meds reliably   Dementia; baseline oriented to place and person and ambulate without assistance , from home  currently have acute metabolic encephalopathy, very weak   FTT: will need PT/OT once medically stable, POA is interested in SNF placement and palliative care consult   DVT prophylaxis: start lovenox , no plan for surgical intervention as of now Code Status:DNR Family Communication: POA at bedside  Disposition:   Status is: Inpatient    Dispo: The patient is from: Home              Anticipated d/c is to: SNF  Anticipated d/c date is: TBD              Patient currently is severely ill  Consultants:   General surgery  Palliative care  Procedures:   MRCP  HIDA scan  Antimicrobials:   Vanco from 5/14 to 5/16 Cefepime from 5/14 Flagyl from 5/14     Objective: Vitals:   10/06/19 2151 10/07/19 0441 10/07/19 0648 10/07/19 0756  BP: (!) 178/102 (!) 161/84  (!)  171/96  Pulse: 79 66  71  Resp: Temp: (!) 97.4 F (36.3 C) (!) 97.5 F (36.4 C)  98 F (36.7 C)  TempSrc:    Oral  SpO2: 97% 100%  100%  Weight:   61.2 kg   Height:        Intake/Output Summary (Last 24 hours) at 10/07/2019 0828 Last data filed at 10/07/2019 0646 Gross per 24 hour  Intake 810.62 ml  Output 300 ml  Net 510.62 ml   Filed Weights   10/05/19 1122 10/07/19 0648  Weight: 59 kg 61.2 kg    Examination:  General exam: Confused, lethargic, hard of hearing Respiratory system: Clear to auscultation. Respiratory effort normal. Cardiovascular system: S1 & S2 heard, RRR. No JVD, no murmur, No pedal edema. Gastrointestinal system: Grimacing when being touched . Normal bowel sounds heard. Central nervous system: Confused and lethargic Extremities: Moving all extremities spontaneously Skin: No rashes, lesions or ulcers Psychiatry: Confused and lethargic    Data Reviewed: I have personally reviewed following labs and imaging studies  CBC: Recent Labs  Lab 10/05/19 1123 10/05/19 1941 10/06/19 0440 10/06/19 1914  WBC 22.5* 22.6* 18.3* 15.8*  NEUTROABS 20.7*  --   --  13.6*  HGB 14.1 12.4 11.4* 12.9  HCT 43.1 39.3 36.1 40.8  MCV 90.0 94.0 93.3 92.3  PLT 140* 124* 120* 148*    Basic Metabolic Panel: Recent Labs  Lab 10/05/19 1123 10/05/19 1941 10/06/19 0440 10/07/19 0657  NA 144  --  140 143  K 2.8*  --  4.5 3.6  CL 110  --  118* 116*  CO2 21*  --  19* 18*  GLUCOSE 139*  --  88 84  BUN 12  --  13 13  CREATININE 0.80 0.59 0.52 0.62  CALCIUM 9.1  --  7.6* 9.0  MG  --   --   --  1.8    GFR: Estimated Creatinine Clearance: 31.3 mL/min (by C-G formula based on SCr of 0.62 mg/dL).  Liver Function Tests: Recent Labs  Lab 10/05/19 1123 10/06/19 0440 10/07/19 0657  AST 207* 96* 48*  ALT 154* 99* 81*  ALKPHOS 196* 134* 187*  BILITOT 4.9* 3.2* 2.8*  PROT 6.6 4.9* 5.8*  ALBUMIN 3.2* 2.5* 3.1*    CBG: Recent Labs  Lab 10/06/19 0850    GLUCAP 73     Recent Results (from the past 240 hour(s))  SARS Coronavirus 2 by RT PCR (hospital order, performed in [facility] hospital lab) Nasopharyngeal Nasopharyngeal Swab     Status: None   Collection Time: 10/05/19 11:23 AM   Specimen: Nasopharyngeal Swab  Result Value Ref Range Status   SARS Coronavirus 2 NEGATIVE NEGATIVE Final    Comment: (NOTE) SARS-CoV-2 target nucleic acids are NOT DETECTED. The SARS-CoV-2 RNA is generally detectable in upper and lower respiratory specimens during the acute phase of infection. The lowest concentration of SARS-CoV-2 viral copies this assay can detect is 250 copies / mL. A negative result does not preclude  SARS-CoV-2 infection and should not be used as the sole basis for treatment or other patient management decisions.  A negative result may occur with improper specimen collection / handling, submission of specimen other than nasopharyngeal swab, presence of viral mutation(s) within the areas targeted by this assay, and inadequate number of viral copies (<250 copies / mL). A negative result must be combined with clinical observations, patient history, and epidemiological information. Fact Sheet for Patients:   BoilerBrush.com.cy Fact Sheet for Healthcare Providers: https://pope.com/ This test is not yet approved or cleared  by the [place] FDA and has been authorized for detection and/or diagnosis of SARS-CoV-2 by FDA under an Emergency Use Authorization (EUA).  This EUA will remain in effect (meaning this test can be used) for the duration of the COVID-19 declaration under Section 564(b)(1) of the Act, 21 U.S.C. section 360bbb-3(b)(1), unless the authorization is terminated or revoked sooner. Performed at [facility], [address], [place], [place] [address]   Culture, blood (Routine X 2) w Reflex to ID Panel     Status: None (Preliminary result)    Collection Time: 10/05/19  1:31 PM   Specimen: BLOOD RIGHT ARM  Result Value Ref Range Status   Specimen Description   Final    BLOOD RIGHT ARM Performed at [facility], [address] [place], [place] [address]    Special Requests   Final    BOTTLES DRAWN AEROBIC AND ANAEROBIC Blood Culture adequate volume Performed at [facility], [address] [place], [place] [address]    Culture   Final    NO GROWTH 2 DAYS Performed at [facility], [address], [place], [place] [address]    Report Status PENDING  Incomplete  Culture, blood (Routine X 2) w Reflex to ID Panel     Status: None (Preliminary result)   Collection Time: 10/05/19  2:28 PM   Specimen: BLOOD  Result Value Ref Range Status   Specimen Description   Final    BLOOD RIGHT ANTECUBITAL Performed at [facility], [address], [place], [place] [address]    Special Requests   Final    BOTTLES DRAWN AEROBIC AND ANAEROBIC Blood Culture adequate volume Performed at [facility], [address] [place], [place] [address]    Culture   Final    NO GROWTH 2 DAYS Performed at [facility], [address] [place], [place] [address]    Report Status PENDING  Incomplete  MRSA PCR Screening     Status: None   Collection Time: 10/06/19  6:18 PM   Specimen: Urine, Random; Nasopharyngeal  Result Value Ref Range Status   MRSA by PCR NEGATIVE NEGATIVE Final    Comment:        The GeneXpert MRSA Assay (FDA approved for NASAL specimens only), is one component of a comprehensive MRSA colonization surveillance program. It is not intended to diagnose MRSA infection nor to guide or monitor treatment for MRSA infections. Performed at [facility], [address] [place], [place] [address]          Radiology Studies: CT Head Wo Contrast  Result Date: 10/05/2019 CLINICAL DATA:  Altered mental status EXAM: CT HEAD WITHOUT  CONTRAST TECHNIQUE: Contiguous axial images were obtained from the base of the skull through the vertex without intravenous contrast. COMPARISON:  10/05/2017 FINDINGS: Brain: There is no acute intracranial hemorrhage, mass effect, or edema. Gray-white differentiation is  preserved. There is no extra-axial fluid collection. Prominence of the ventricles and sulci reflects generalized parenchymal volume loss. Patchy and confluent areas of hypoattenuation in the supratentorial white matter are nonspecific but probably reflect moderate chronic microvascular ischemic changes. Vascular: There is atherosclerotic calcification at the skull base. Skull: Calvarium is unremarkable. Sinuses/Orbits: Mild mucosal thickening. Orbits are unremarkable. Other: Mastoid air cells are clear. IMPRESSION: No acute intracranial hemorrhage, mass effect, or evidence of acute infarction. Moderate chronic microvascular ischemic changes. Electronically Signed   By: Guadlupe SpanishPraneil  Patel M.D.   On: 10/05/2019 15:01   NM Hepatobiliary Liver Func  Addendum Date: 10/06/2019   ADDENDUM REPORT: 10/06/2019 17:56 ADDENDUM: This case was subsequently reviewed in conjunction with additional consulting radiologist. MRI/MRCP from earlier today was also re-evaluated. The prominent and persistent liver activity noted extending through the end of the second hour of imaging is concerning for intrinsic hepatocyte dysfunction possibly secondary to hepatitis. Therefore, the lack of gallbladder filling is less specific for acute cholecystitis and may be due to diminished biliary activity due to hepatocellular dysfunction. Electronically Signed   By: Signa Kellaylor  Stroud M.D.   On: 10/06/2019 17:56   Result Date: 10/06/2019 CLINICAL DATA:  Evaluate for cholecystitis. Altered mental status, vomiting, sepsis and gallstones EXAM: NUCLEAR MEDICINE HEPATOBILIARY IMAGING TECHNIQUE: Sequential images of the abdomen were obtained out to 60 minutes following intravenous  administration of radiopharmaceutical. RADIOPHARMACEUTICALS:  7.5 mCi [id number]  Choletec IV COMPARISON:  MRI abdomen 08/06/2019 and abdominal sonogram 10/05/2019. FINDINGS: Prompt radiotracer uptake by the liver is seen. During the first hour of imaging there is faint activity noted within the common bile duct. No gallbladder or bowel activity noted during the first hour. During the second hour there is progressive radiotracer accumulation within the common bile duct and bowel loops. No gallbladder activity identified however. IMPRESSION: 1. Nonvisualization of the gallbladder after 2 hours of imaging compatible with cystic duct obstruction due to cholecystitis. 2. Delayed biliary accumulation which may reflect underlying cholestasis. Electronically Signed: By: Signa Kellaylor  Stroud M.D. On: 10/06/2019 13:17   US Pelvis Complete  Result Date: 10/05/2019 CLINICAL DATA:  Mass on CT imaging EXAM: TRANSABDOMINAL ULTRASOUND OF PELVIS TECHNIQUE: Transabdominal ultrasound examination of the pelvis was performed including evaluation of the uterus, ovaries, adnexal regions, and pelvic cul-de-sac. COMPARISON:  CT abdomen pelvis 09/05/2019 FINDINGS: Uterus Measurements: 5.6 x 2.9 x 4.0 cm = volume: 33 mL. Atrophic appearance, appropriate for age. No fibroid or mass visualized. Endometrium Thickness: 4 mm, non thickened.  Atrophic without focal abnormality. Right ovary Measurements: 2.8 x 1.5 x 1.7 cm = volume: 3.9 mL. Small 1.6 x 0.9 x 1.3 cm anechoic cystic structure possibly exophytic from or arising adjacent to the right ovary. Left ovary Measurements: 7.6 x 7.2 x 8 cm = volume: 228 mL. Possibly erroneously measurement given unclear margins of the normal left ovarian parenchyma alongside the larger anechoic cystic structures in the left adnexa. The largest measures 6.5 x 5.3 x 6.7 cm corresponding well to larger lesion seen on CT. A smaller more tubular cystic area is seen posterior measuring 4.4 x 2.6 x 5.5 cm, possibly  reflecting a dilated tube/hydrosalpinx. Other findings: Small volume of free fluid is noted in the posterior cul-de-sac IMPRESSION: Corresponding to the abnormality on CT imaging is a large 6.5 x 5.3 x 6.7 cm cyst without significant internal complexity. More posteriorly an elongated 4.4 x 2.6 x 5.5 cm anechoic structure possibly reflects a dilated tube/hydrosalpinx. Additional 1.6 x 0.9 x 1.3 cm ovarian versus paraovarian cyst  in the right adnexa. While these do not demonstrate significant internal complexity or typically concerning features, size criteria warrants at minimum six-month surveillance ultrasound to assess for interval growth. Furthermore consider gynecologic consultation if not previously obtained. This recommendation follows consensus guidelines: Simple Adnexal Cysts: SRU Consensus Conference Update on Follow-up and Reporting. Radiology 2019; [phone]. Electronically Signed   By: Kreg Shropshire M.D.   On: 10/05/2019 17:06   CT Abdomen Pelvis W Contrast  Result Date: 10/05/2019 CLINICAL DATA:  86 year old with nausea and vomiting. Suspected diverticulitis. Abnormal LFTs. EXAM: CT ABDOMEN AND PELVIS WITH CONTRAST TECHNIQUE: Multidetector CT imaging of the abdomen and pelvis was performed using the standard protocol following bolus administration of intravenous contrast. CONTRAST:  75mL OMNIPAQUE IOHEXOL 300 MG/ML  SOLN COMPARISON:  None. FINDINGS: Lower chest: Minimal pleural fluid or thickening in the posterior right chest. Minimal left posterior pleural thickening. Eccentric soft tissue adjacent to the distal right pulmonary artery on sequence 2 image 1. This appears to be causing narrowing or compression of the distal right pulmonary artery. Atherosclerotic calcifications in the visualized thoracic aorta. Patchy densities at the lung bases could represent atelectasis or areas of scarring. Hepatobiliary: Gallbladder is mildly distended. There may be a small amount of fluid adjacent to the  gallbladder. No significant intrahepatic or extrahepatic biliary dilatation. Main portal venous system is patent. Pancreas: Unremarkable. No pancreatic ductal dilatation or surrounding inflammatory changes. Spleen: Normal in size without focal abnormality. Adrenals/Urinary Tract: No gross abnormality to the adrenal glands. Normal appearance of both kidneys. No appearance of the urinary bladder. Small low-density structure in the posterior left kidney is too small to definitively characterize but could represent a small cyst. No hydronephrosis. Stomach/Bowel: Normal appearance of the stomach and duodenum. Rectum is distended with a large amount of stool. Wall thickening involving the distal sigmoid colon adjacent to the rectal stool. Diverticula involving the sigmoid colon. No clear evidence for acute bowel inflammation. No evidence for a bowel obstruction. Vascular/Lymphatic: Atherosclerotic calcifications in the abdominal aorta without aneurysm. Main visceral arteries are patent. Venous structures are unremarkable. No evidence for abdominopelvic lymphadenopathy. Reproductive: Uterus appears to be present. There is a large low-density structure in the left hemipelvis that measures 6.5 x 5.8 x 6.1 cm. There is an additional low-density collection posterior to the larger lesion that measures 3.8 x 2.5 x 4.7 cm. Findings are concerning for left ovarian/adnexal cystic lesions. Other: There may be a small amount of fluid around the gallbladder. Otherwise, no significant ascites. Negative for free air. Musculoskeletal: Mild anterolisthesis at L4-L5 secondary to facet arthropathy. Vertebral body compression deformity and fracture at T12 and likely new since the chest radiograph on 10/05/2017. IMPRESSION: 1. Low density lesions in the left hemipelvis, largest measuring up to 6.5 cm. Findings are concerning for left ovarian/adnexal cystic lesions. Recommend further characterization with pelvic ultrasound. 2. Gallbladder is  mildly distended. Cannot exclude a small amount of pericholecystic fluid. Elevated bilirubin and abnormal LFTs. Findings raise concern for underlying gallbladder inflammation and recommend further characterization with right upper quadrant ultrasound. 3. Soft tissue attenuation in the region of the distal right pulmonary artery. This area is incompletely evaluated. Findings could be associated with chronic pulmonary artery thrombus or adjacent soft tissue. Findings are equivocal on this abdominal CT. This may be better characterized with CTA of the chest using with pulmonary embolism protocol. 4. Diverticulosis involving the sigmoid colon but limited evaluation of sigmoid colon due to the large low-density pelvic collections. Rectum is distended with large amount of  stool. 5. T12 compression fracture of uncertain age. These results were called by telephone at the time of interpretation on 10/05/2019 at 3:26 pm to provider Vanetta Mulders , who verbally acknowledged these results. Electronically Signed   By: Richarda Overlie M.D.   On: 10/05/2019 15:27   MR 3D Recon At Scanner  Addendum Date: 10/06/2019   ADDENDUM REPORT: 10/06/2019 17:58 ADDENDUM: This case has been subsequently reviewed with additional consulting radiologist. In conjunction with findings from nuclear medicine hepatic biliary scan from today. Findings are less specific for acute cholecystitis and could be due to liver inflammation due to hepatitis. Electronically Signed   By: Signa Kell M.D.   On: 10/06/2019 17:58   Result Date: 10/06/2019 CLINICAL DATA:  Abdominal pain. Suspect cholecystitis. EXAM: MRI ABDOMEN WITHOUT AND WITH CONTRAST (INCLUDING MRCP) TECHNIQUE: Multiplanar multisequence MR imaging of the abdomen was performed both before and after the administration of intravenous contrast. Heavily T2-weighted images of the biliary and pancreatic ducts were obtained, and three-dimensional MRCP images were rendered by post processing. CONTRAST:   60mL GADAVIST GADOBUTROL 1 MMOL/ML IV SOLN COMPARISON:  CT AP 10/05/2019 FINDINGS: Lower chest: Small to moderate bilateral pleural effusions. Hepatobiliary: 5 mm cyst identified within the anterior right lobe of liver. Postcontrast imaging is limited due to excessive motion artifact. Multiple stones are identified within the gallbladder which measure up to 9 mm. Gallbladder wall enhancement and mild edema noted. Right upper quadrant pericholecystic fluid is identified. The common bile duct is nondilated measuring 4 mm. No convincing evidence for choledocholithiasis. Pancreas: Evaluation of the pancreas is significantly diminished due to motion artifact. No findings to suggest main duct dilatation, inflammation or mass. Spleen:  Within normal limits in size and appearance. Adrenals/Urinary Tract: No masses identified. No evidence of hydronephrosis. Stomach/Bowel: Stomach appears nondistended. No dilated loops of bowel identified within the upper abdomen. Vascular/Lymphatic: No pathologically enlarged lymph nodes identified. No abdominal aortic aneurysm demonstrated. Other: Right upper quadrant free fluid is identified and appears increased from the previous exam. Musculoskeletal: T12 compression deformity is again noted. IMPRESSION: 1. Examination is significantly diminished due to motion artifact. 2. There are multiple gallstones identified within the gallbladder. There is gallbladder wall enhancement and progressive right upper quadrant and pericholecystic fluid. Cannot rule out acute cholecystitis. 3. No convincing evidence for choledocholithiasis. No evidence for CBD or main pancreatic duct dilatation. 4. Small to moderate bilateral pleural effusions. Electronically Signed: By: Signa Kell M.D. On: 10/06/2019 10:57   US Abdomen Limited  Result Date: 10/05/2019 CLINICAL DATA:  Possible cholecystitis on CT. EXAM: ULTRASOUND ABDOMEN LIMITED RIGHT UPPER QUADRANT COMPARISON:  CT abdomen and pelvis 10/05/2019  FINDINGS: Gallbladder: Multiple gallstones measuring up to 1.5 cm. Prominent gallbladder wall thickening up to 1.3 cm with pericholecystic fluid. No sonographic Murphy sign noted by sonographer. Common bile duct: Diameter: 5 mm Liver: Diffusely increased parenchymal echogenicity without focal lesion identified. Portal vein is patent on color Doppler imaging with normal direction of blood flow towards the liver. Other: None. IMPRESSION: 1. Distended gallbladder with gallstones, prominent gallbladder wall thickening, and pericholecystic fluid. This is concerning for acute cholecystitis in the appropriate clinical setting, although note that the sonographic Eulah Pont sign was negative. 2. Echogenic liver which may reflect steatosis. Electronically Signed   By: Sebastian Ache M.D.   On: 10/05/2019 17:02   DG Chest Port 1 View  Result Date: 10/05/2019 CLINICAL DATA:  Fever EXAM: PORTABLE CHEST 1 VIEW COMPARISON:  10/05/2017 FINDINGS: Chronically elevated right hemidiaphragm with right lower lobe  atelectasis unchanged. Left lung is clear. Negative for heart failure infiltrate or effusion. No interval change. IMPRESSION: No active disease. Electronically Signed   By: Marlan Palau M.D.   On: 10/05/2019 12:09   MR ABDOMEN MRCP W WO CONTAST  Addendum Date: 10/06/2019   ADDENDUM REPORT: 10/06/2019 17:58 ADDENDUM: This case has been subsequently reviewed with additional consulting radiologist. In conjunction with findings from nuclear medicine hepatic biliary scan from today. Findings are less specific for acute cholecystitis and could be due to liver inflammation due to hepatitis. Electronically Signed   By: Signa Kell M.D.   On: 10/06/2019 17:58   Result Date: 10/06/2019 CLINICAL DATA:  Abdominal pain. Suspect cholecystitis. EXAM: MRI ABDOMEN WITHOUT AND WITH CONTRAST (INCLUDING MRCP) TECHNIQUE: Multiplanar multisequence MR imaging of the abdomen was performed both before and after the administration of intravenous  contrast. Heavily T2-weighted images of the biliary and pancreatic ducts were obtained, and three-dimensional MRCP images were rendered by post processing. CONTRAST:  1mL GADAVIST GADOBUTROL 1 MMOL/ML IV SOLN COMPARISON:  CT AP 10/05/2019 FINDINGS: Lower chest: Small to moderate bilateral pleural effusions. Hepatobiliary: 5 mm cyst identified within the anterior right lobe of liver. Postcontrast imaging is limited due to excessive motion artifact. Multiple stones are identified within the gallbladder which measure up to 9 mm. Gallbladder wall enhancement and mild edema noted. Right upper quadrant pericholecystic fluid is identified. The common bile duct is nondilated measuring 4 mm. No convincing evidence for choledocholithiasis. Pancreas: Evaluation of the pancreas is significantly diminished due to motion artifact. No findings to suggest main duct dilatation, inflammation or mass. Spleen:  Within normal limits in size and appearance. Adrenals/Urinary Tract: No masses identified. No evidence of hydronephrosis. Stomach/Bowel: Stomach appears nondistended. No dilated loops of bowel identified within the upper abdomen. Vascular/Lymphatic: No pathologically enlarged lymph nodes identified. No abdominal aortic aneurysm demonstrated. Other: Right upper quadrant free fluid is identified and appears increased from the previous exam. Musculoskeletal: T12 compression deformity is again noted. IMPRESSION: 1. Examination is significantly diminished due to motion artifact. 2. There are multiple gallstones identified within the gallbladder. There is gallbladder wall enhancement and progressive right upper quadrant and pericholecystic fluid. Cannot rule out acute cholecystitis. 3. No convincing evidence for choledocholithiasis. No evidence for CBD or main pancreatic duct dilatation. 4. Small to moderate bilateral pleural effusions. Electronically Signed: By: Signa Kell M.D. On: 10/06/2019 10:57        Scheduled  Meds:  enalaprilat  0.625 mg Intravenous Q8H   haloperidol lactate  2 mg Intravenous Once   Continuous Infusions:  sodium chloride 10 mL/hr at 10/07/19 0600   ceFEPime (MAXIPIME) IV 2 g (10/07/19 0258)   metronidazole 500 mg (10/07/19 0646)   vancomycin 750 mg (10/06/19 1727)     LOS: 2 days     Time spent: I have personally reviewed and interpreted on  10/07/2019 daily labs, tele strips, imagings as discussed above under date review session and assessment and plans.  I reviewed all nursing notes, pharmacy notes, consultant notes,  vitals, pertinent old records  I have discussed plan of care as described above with RN , patient and POA on 10/07/2019  Voice Recognition /Dragon dictation system was used to create this note, attempts have been made to correct errors. Please contact the author with questions and/or clarifications.   Albertine Grates, MD PhD FACP [facility]  Available via Epic secure chat 7am-7pm for nonurgent issues Please page for urgent issues To page the attending provider between 7A-7P  or the covering provider during after hours 7P-7A, please log into the web site [email or web address] and access using universal [place] password for that web site. If you do not have the password, please call the hospital operator.    10/07/2019, 8:28 AM

## 2019-10-07 NOTE — Progress Notes (Signed)
[facility]:  [phone] General Surgery Progress Note   LOS: 2 days  POD -     Chief Complaint: Confusion  Assessment and Plan: 1  Cholelithiasis with possible cholecystitis  WBC - 18,300 - 10/06/2019  MRI of abdomen 10/06/2019 - motion artifact, multiple gall stones, no evidence of choledocholithiasis  Maxipime/Flagyl/Vanc  Equivocal hida scan given hepatocyte dysfunction.     Definitely do not recommend surgery.  Pt too frail.  Seems to be improving with antibiotics.  Continue this and follow LFTs.    2.  Abnormal LFT's, but better today  4.9 (5/14) --> 3.2 (5/15)-> 2.8 (5/16) 3.  Malnutrition  Albumin - 2.5 - 10/06/2019 4.  Advanced age and frailty   Active Problems:   Sepsis ([place])  Subjective:  Denies pain, not able to give complex responses or answer complex questions.   Objective:   Vitals:   10/07/19 0441 10/07/19 0756  BP: (!) 161/84 (!) 171/96  Pulse: 66 71  Resp: 20 18  Temp: (!) 97.5 F (36.4 C) 98 F (36.7 C)  SpO2: 100% 100%     Intake/Output from previous day:  05/15 0701 - 05/16 0700 In: 910.6 [I.V.:410.6; IV Piggyback:500] Out: 300 [Urine:300]  Intake/Output this shift:  Total I/O In: 100 [P.O.:100] Out: -    Physical Exam:   General: Thin older WF who is alert.  Pleasant.  No distress.     HEENT: Normal. Pupils equal. Anicteric.   .   Lungs: Clear.   Abdomen:  Non tender, non distended, no H/S/M   Lab Results:    Recent Labs    10/06/19 0440 10/06/19 1914  WBC 18.3* 15.8*  HGB 11.4* 12.9  HCT 36.1 40.8  PLT 120* 148*    BMET   Recent Labs    10/06/19 0440 10/07/19 0657  NA 140 143  K 4.5 3.6  CL 118* 116*  CO2 19* 18*  GLUCOSE 88 84  BUN 13 13  CREATININE 0.52 0.62  CALCIUM 7.6* 9.0    PT/INR   Recent Labs    10/05/19 1514 10/07/19 0657  LABPROT 16.6* 14.2  INR 1.4* 1.1    ABG  No results for input(s): PHART, HCO3 in the last 72 hours.  Invalid input(s): PCO2,  PO2   Studies/Results:  CT Head Wo Contrast  Result Date: 10/05/2019 CLINICAL DATA:  Altered mental status EXAM: CT HEAD WITHOUT CONTRAST TECHNIQUE: Contiguous axial images were obtained from the base of the skull through the vertex without intravenous contrast. COMPARISON:  10/05/2017 FINDINGS: Brain: There is no acute intracranial hemorrhage, mass effect, or edema. Gray-white differentiation is preserved. There is no extra-axial fluid collection. Prominence of the ventricles and sulci reflects generalized parenchymal volume loss. Patchy and confluent areas of hypoattenuation in the supratentorial white matter are nonspecific but probably reflect moderate chronic microvascular ischemic changes. Vascular: There is atherosclerotic calcification at the skull base. Skull: Calvarium is unremarkable. Sinuses/Orbits: Mild mucosal thickening. Orbits are unremarkable. Other: Mastoid air cells are clear. IMPRESSION: No acute intracranial hemorrhage, mass effect, or evidence of acute infarction. Moderate chronic microvascular ischemic changes. Electronically Signed   By: Macy Mis M.D.   On: 10/05/2019 15:01   NM Hepatobiliary Liver Func  Addendum Date: 10/06/2019   ADDENDUM REPORT: 10/06/2019 17:56 ADDENDUM: This case was subsequently reviewed in conjunction with additional consulting radiologist. MRI/MRCP from earlier today was also re-evaluated. The prominent and persistent liver activity noted extending through the end of the second hour of imaging is concerning  for intrinsic hepatocyte dysfunction possibly secondary to hepatitis. Therefore, the lack of gallbladder filling is less specific for acute cholecystitis and may be due to diminished biliary activity due to hepatocellular dysfunction. Electronically Signed   By: Signa Kell M.D.   On: 10/06/2019 17:56   Result Date: 10/06/2019 CLINICAL DATA:  Evaluate for cholecystitis. Altered mental status, vomiting, sepsis and gallstones EXAM: NUCLEAR  MEDICINE HEPATOBILIARY IMAGING TECHNIQUE: Sequential images of the abdomen were obtained out to 60 minutes following intravenous administration of radiopharmaceutical. RADIOPHARMACEUTICALS:  7.5 mCi Tc-3m  Choletec IV COMPARISON:  MRI abdomen 08/06/2019 and abdominal sonogram 10/05/2019. FINDINGS: Prompt radiotracer uptake by the liver is seen. During the first hour of imaging there is faint activity noted within the common bile duct. No gallbladder or bowel activity noted during the first hour. During the second hour there is progressive radiotracer accumulation within the common bile duct and bowel loops. No gallbladder activity identified however. IMPRESSION: 1. Nonvisualization of the gallbladder after 2 hours of imaging compatible with cystic duct obstruction due to cholecystitis. 2. Delayed biliary accumulation which may reflect underlying cholestasis. Electronically Signed: By: Signa Kell M.D. On: 10/06/2019 13:17   US Pelvis Complete  Result Date: 10/05/2019 CLINICAL DATA:  Mass on CT imaging EXAM: TRANSABDOMINAL ULTRASOUND OF PELVIS TECHNIQUE: Transabdominal ultrasound examination of the pelvis was performed including evaluation of the uterus, ovaries, adnexal regions, and pelvic cul-de-sac. COMPARISON:  CT abdomen pelvis 09/05/2019 FINDINGS: Uterus Measurements: 5.6 x 2.9 x 4.0 cm = volume: 33 mL. Atrophic appearance, appropriate for age. No fibroid or mass visualized. Endometrium Thickness: 4 mm, non thickened.  Atrophic without focal abnormality. Right ovary Measurements: 2.8 x 1.5 x 1.7 cm = volume: 3.9 mL. Small 1.6 x 0.9 x 1.3 cm anechoic cystic structure possibly exophytic from or arising adjacent to the right ovary. Left ovary Measurements: 7.6 x 7.2 x 8 cm = volume: 228 mL. Possibly erroneously measurement given unclear margins of the normal left ovarian parenchyma alongside the larger anechoic cystic structures in the left adnexa. The largest measures 6.5 x 5.3 x 6.7 cm corresponding well  to larger lesion seen on CT. A smaller more tubular cystic area is seen posterior measuring 4.4 x 2.6 x 5.5 cm, possibly reflecting a dilated tube/hydrosalpinx. Other findings: Small volume of free fluid is noted in the posterior cul-de-sac IMPRESSION: Corresponding to the abnormality on CT imaging is a large 6.5 x 5.3 x 6.7 cm cyst without significant internal complexity. More posteriorly an elongated 4.4 x 2.6 x 5.5 cm anechoic structure possibly reflects a dilated tube/hydrosalpinx. Additional 1.6 x 0.9 x 1.3 cm ovarian versus paraovarian cyst in the right adnexa. While these do not demonstrate significant internal complexity or typically concerning features, size criteria warrants at minimum six-month surveillance ultrasound to assess for interval growth. Furthermore consider gynecologic consultation if not previously obtained. This recommendation follows consensus guidelines: Simple Adnexal Cysts: SRU Consensus Conference Update on Follow-up and Reporting. Radiology 2019; [id number]. Electronically Signed   By: Kreg Shropshire M.D.   On: 10/05/2019 17:06   CT Abdomen Pelvis W Contrast  Result Date: 10/05/2019 CLINICAL DATA:  86 year old with nausea and vomiting. Suspected diverticulitis. Abnormal LFTs. EXAM: CT ABDOMEN AND PELVIS WITH CONTRAST TECHNIQUE: Multidetector CT imaging of the abdomen and pelvis was performed using the standard protocol following bolus administration of intravenous contrast. CONTRAST:  52mL OMNIPAQUE IOHEXOL 300 MG/ML  SOLN COMPARISON:  None. FINDINGS: Lower chest: Minimal pleural fluid or thickening in the posterior right chest. Minimal left posterior pleural  thickening. Eccentric soft tissue adjacent to the distal right pulmonary artery on sequence 2 image 1. This appears to be causing narrowing or compression of the distal right pulmonary artery. Atherosclerotic calcifications in the visualized thoracic aorta. Patchy densities at the lung bases could represent atelectasis or areas  of scarring. Hepatobiliary: Gallbladder is mildly distended. There may be a small amount of fluid adjacent to the gallbladder. No significant intrahepatic or extrahepatic biliary dilatation. Main portal venous system is patent. Pancreas: Unremarkable. No pancreatic ductal dilatation or surrounding inflammatory changes. Spleen: Normal in size without focal abnormality. Adrenals/Urinary Tract: No gross abnormality to the adrenal glands. Normal appearance of both kidneys. No appearance of the urinary bladder. Small low-density structure in the posterior left kidney is too small to definitively characterize but could represent a small cyst. No hydronephrosis. Stomach/Bowel: Normal appearance of the stomach and duodenum. Rectum is distended with a large amount of stool. Wall thickening involving the distal sigmoid colon adjacent to the rectal stool. Diverticula involving the sigmoid colon. No clear evidence for acute bowel inflammation. No evidence for a bowel obstruction. Vascular/Lymphatic: Atherosclerotic calcifications in the abdominal aorta without aneurysm. Main visceral arteries are patent. Venous structures are unremarkable. No evidence for abdominopelvic lymphadenopathy. Reproductive: Uterus appears to be present. There is a large low-density structure in the left hemipelvis that measures 6.5 x 5.8 x 6.1 cm. There is an additional low-density collection posterior to the larger lesion that measures 3.8 x 2.5 x 4.7 cm. Findings are concerning for left ovarian/adnexal cystic lesions. Other: There may be a small amount of fluid around the gallbladder. Otherwise, no significant ascites. Negative for free air. Musculoskeletal: Mild anterolisthesis at L4-L5 secondary to facet arthropathy. Vertebral body compression deformity and fracture at T12 and likely new since the chest radiograph on 10/05/2017. IMPRESSION: 1. Low density lesions in the left hemipelvis, largest measuring up to 6.5 cm. Findings are concerning for  left ovarian/adnexal cystic lesions. Recommend further characterization with pelvic ultrasound. 2. Gallbladder is mildly distended. Cannot exclude a small amount of pericholecystic fluid. Elevated bilirubin and abnormal LFTs. Findings raise concern for underlying gallbladder inflammation and recommend further characterization with right upper quadrant ultrasound. 3. Soft tissue attenuation in the region of the distal right pulmonary artery. This area is incompletely evaluated. Findings could be associated with chronic pulmonary artery thrombus or adjacent soft tissue. Findings are equivocal on this abdominal CT. This may be better characterized with CTA of the chest using with pulmonary embolism protocol. 4. Diverticulosis involving the sigmoid colon but limited evaluation of sigmoid colon due to the large low-density pelvic collections. Rectum is distended with large amount of stool. 5. T12 compression fracture of uncertain age. These results were called by telephone at the time of interpretation on 10/05/2019 at 3:26 pm to provider Vanetta Mulders , who verbally acknowledged these results. Electronically Signed   By: Richarda Overlie M.D.   On: 10/05/2019 15:27   MR 3D Recon At Scanner  Addendum Date: 10/06/2019   ADDENDUM REPORT: 10/06/2019 17:58 ADDENDUM: This case has been subsequently reviewed with additional consulting radiologist. In conjunction with findings from nuclear medicine hepatic biliary scan from today. Findings are less specific for acute cholecystitis and could be due to liver inflammation due to hepatitis. Electronically Signed   By: Signa Kell M.D.   On: 10/06/2019 17:58   Result Date: 10/06/2019 CLINICAL DATA:  Abdominal pain. Suspect cholecystitis. EXAM: MRI ABDOMEN WITHOUT AND WITH CONTRAST (INCLUDING MRCP) TECHNIQUE: Multiplanar multisequence MR imaging of the abdomen was performed  both before and after the administration of intravenous contrast. Heavily T2-weighted images of the biliary  and pancreatic ducts were obtained, and three-dimensional MRCP images were rendered by post processing. CONTRAST:  65mL GADAVIST GADOBUTROL 1 MMOL/ML IV SOLN COMPARISON:  CT AP 10/05/2019 FINDINGS: Lower chest: Small to moderate bilateral pleural effusions. Hepatobiliary: 5 mm cyst identified within the anterior right lobe of liver. Postcontrast imaging is limited due to excessive motion artifact. Multiple stones are identified within the gallbladder which measure up to 9 mm. Gallbladder wall enhancement and mild edema noted. Right upper quadrant pericholecystic fluid is identified. The common bile duct is nondilated measuring 4 mm. No convincing evidence for choledocholithiasis. Pancreas: Evaluation of the pancreas is significantly diminished due to motion artifact. No findings to suggest main duct dilatation, inflammation or mass. Spleen:  Within normal limits in size and appearance. Adrenals/Urinary Tract: No masses identified. No evidence of hydronephrosis. Stomach/Bowel: Stomach appears nondistended. No dilated loops of bowel identified within the upper abdomen. Vascular/Lymphatic: No pathologically enlarged lymph nodes identified. No abdominal aortic aneurysm demonstrated. Other: Right upper quadrant free fluid is identified and appears increased from the previous exam. Musculoskeletal: T12 compression deformity is again noted. IMPRESSION: 1. Examination is significantly diminished due to motion artifact. 2. There are multiple gallstones identified within the gallbladder. There is gallbladder wall enhancement and progressive right upper quadrant and pericholecystic fluid. Cannot rule out acute cholecystitis. 3. No convincing evidence for choledocholithiasis. No evidence for CBD or main pancreatic duct dilatation. 4. Small to moderate bilateral pleural effusions. Electronically Signed: By: Signa Kell M.D. On: 10/06/2019 10:57   US Abdomen Limited  Result Date: 10/05/2019 CLINICAL DATA:  Possible  cholecystitis on CT. EXAM: ULTRASOUND ABDOMEN LIMITED RIGHT UPPER QUADRANT COMPARISON:  CT abdomen and pelvis 10/05/2019 FINDINGS: Gallbladder: Multiple gallstones measuring up to 1.5 cm. Prominent gallbladder wall thickening up to 1.3 cm with pericholecystic fluid. No sonographic Murphy sign noted by sonographer. Common bile duct: Diameter: 5 mm Liver: Diffusely increased parenchymal echogenicity without focal lesion identified. Portal vein is patent on color Doppler imaging with normal direction of blood flow towards the liver. Other: None. IMPRESSION: 1. Distended gallbladder with gallstones, prominent gallbladder wall thickening, and pericholecystic fluid. This is concerning for acute cholecystitis in the appropriate clinical setting, although note that the sonographic Eulah Pont sign was negative. 2. Echogenic liver which may reflect steatosis. Electronically Signed   By: Sebastian Ache M.D.   On: 10/05/2019 17:02   DG Chest Port 1 View  Result Date: 10/05/2019 CLINICAL DATA:  Fever EXAM: PORTABLE CHEST 1 VIEW COMPARISON:  10/05/2017 FINDINGS: Chronically elevated right hemidiaphragm with right lower lobe atelectasis unchanged. Left lung is clear. Negative for heart failure infiltrate or effusion. No interval change. IMPRESSION: No active disease. Electronically Signed   By: Marlan Palau M.D.   On: 10/05/2019 12:09   MR ABDOMEN MRCP W WO CONTAST  Addendum Date: 10/06/2019   ADDENDUM REPORT: 10/06/2019 17:58 ADDENDUM: This case has been subsequently reviewed with additional consulting radiologist. In conjunction with findings from nuclear medicine hepatic biliary scan from today. Findings are less specific for acute cholecystitis and could be due to liver inflammation due to hepatitis. Electronically Signed   By: Signa Kell M.D.   On: 10/06/2019 17:58   Result Date: 10/06/2019 CLINICAL DATA:  Abdominal pain. Suspect cholecystitis. EXAM: MRI ABDOMEN WITHOUT AND WITH CONTRAST (INCLUDING MRCP) TECHNIQUE:  Multiplanar multisequence MR imaging of the abdomen was performed both before and after the administration of intravenous contrast. Heavily T2-weighted images  of the biliary and pancreatic ducts were obtained, and three-dimensional MRCP images were rendered by post processing. CONTRAST:  6mL GADAVIST GADOBUTROL 1 MMOL/ML IV SOLN COMPARISON:  CT AP 10/05/2019 FINDINGS: Lower chest: Small to moderate bilateral pleural effusions. Hepatobiliary: 5 mm cyst identified within the anterior right lobe of liver. Postcontrast imaging is limited due to excessive motion artifact. Multiple stones are identified within the gallbladder which measure up to 9 mm. Gallbladder wall enhancement and mild edema noted. Right upper quadrant pericholecystic fluid is identified. The common bile duct is nondilated measuring 4 mm. No convincing evidence for choledocholithiasis. Pancreas: Evaluation of the pancreas is significantly diminished due to motion artifact. No findings to suggest main duct dilatation, inflammation or mass. Spleen:  Within normal limits in size and appearance. Adrenals/Urinary Tract: No masses identified. No evidence of hydronephrosis. Stomach/Bowel: Stomach appears nondistended. No dilated loops of bowel identified within the upper abdomen. Vascular/Lymphatic: No pathologically enlarged lymph nodes identified. No abdominal aortic aneurysm demonstrated. Other: Right upper quadrant free fluid is identified and appears increased from the previous exam. Musculoskeletal: T12 compression deformity is again noted. IMPRESSION: 1. Examination is significantly diminished due to motion artifact. 2. There are multiple gallstones identified within the gallbladder. There is gallbladder wall enhancement and progressive right upper quadrant and pericholecystic fluid. Cannot rule out acute cholecystitis. 3. No convincing evidence for choledocholithiasis. No evidence for CBD or main pancreatic duct dilatation. 4. Small to moderate  bilateral pleural effusions. Electronically Signed: By: Signa Kellaylor  Stroud M.D. On: 10/06/2019 10:57     Anti-infectives:   Anti-infectives (From admission, onward)   Start     Dose/Rate Route Frequency Ordered Stop   10/06/19 1600  vancomycin (VANCOREADY) IVPB 750 mg/150 mL  Status:  Discontinued     750 mg 150 mL/hr over 60 Minutes Intravenous Every 24 hours 10/05/19 2001 10/07/19 0830   10/06/19 0200  ceFEPIme (MAXIPIME) 2 g in sodium chloride 0.9 % 100 mL IVPB     2 g 200 mL/hr over 30 Minutes Intravenous Every 12 hours 10/05/19 2001     10/05/19 2300  metroNIDAZOLE (FLAGYL) IVPB 500 mg     500 mg 100 mL/hr over 60 Minutes Intravenous Every 8 hours 10/05/19 1940     10/05/19 2015  vancomycin (VANCOCIN) IVPB 1000 mg/200 mL premix  Status:  Discontinued     1,000 mg 200 mL/hr over 60 Minutes Intravenous  Once 10/05/19 1940 10/05/19 1944   10/05/19 1945  ceFEPIme (MAXIPIME) 2 g in sodium chloride 0.9 % 100 mL IVPB  Status:  Discontinued     2 g 200 mL/hr over 30 Minutes Intravenous  Once 10/05/19 1940 10/05/19 1945   10/05/19 1500  vancomycin (VANCOREADY) IVPB 1250 mg/250 mL     1,250 mg 166.7 mL/hr over 90 Minutes Intravenous  Once 10/05/19 1359 10/05/19 1949   10/05/19 1345  ceFEPIme (MAXIPIME) 2 g in sodium chloride 0.9 % 100 mL IVPB     2 g 200 mL/hr over 30 Minutes Intravenous  Once 10/05/19 1336 10/05/19 1533   10/05/19 1345  metroNIDAZOLE (FLAGYL) IVPB 500 mg     500 mg 100 mL/hr over 60 Minutes Intravenous  Once 10/05/19 1336 10/05/19 1636   10/05/19 1345  vancomycin (VANCOCIN) IVPB 1000 mg/200 mL premix  Status:  Discontinued     1,000 mg 200 mL/hr over 60 Minutes Intravenous  Once 10/05/19 1336 10/05/19 1359      Maudry DiegoFaera L Phyllip Claw, MD FACS Surgical Oncology, General Surgery, Trauma and Critical Care [facility], [place] [phone] for weekday/non holidays Check [email or web address] for coverage night/weekend/holidays  Do not use SecureChat as it is not reliable for  timely patient care.

## 2019-10-08 LAB — URINE CULTURE: Culture: <10000 — AB

## 2019-10-08 LAB — CBC WITH DIFFERENTIAL/PLATELET
Abs Immature Granulocytes: 0.09 10*3/uL — ABNORMAL HIGH (ref 0.00–0.07)
Basophils Absolute: 0 10*3/uL (ref 0.0–0.1)
Basophils Relative: 0 %
Eosinophils Absolute: 0.1 10*3/uL (ref 0.0–0.5)
Eosinophils Relative: 2 %
HCT: 35.3 % — ABNORMAL LOW (ref 36.0–46.0)
Hemoglobin: 11.4 g/dL — ABNORMAL LOW (ref 12.0–15.0)
Immature Granulocytes: 1 %
Lymphocytes Relative: 15 %
Lymphs Abs: 1.2 10*3/uL (ref 0.7–4.0)
MCH: 29.1 pg (ref 26.0–34.0)
MCHC: 32.3 g/dL (ref 30.0–36.0)
MCV: 90.1 fL (ref 80.0–100.0)
Monocytes Absolute: 0.5 10*3/uL (ref 0.1–1.0)
Monocytes Relative: 7 %
Neutro Abs: 6.1 10*3/uL (ref 1.7–7.7)
Neutrophils Relative %: 75 %
Platelets: 148 10*3/uL — ABNORMAL LOW (ref 150–400)
RBC: 3.92 MIL/uL (ref 3.87–5.11)
RDW: 14.4 % (ref 11.5–15.5)
WBC: 8.1 10*3/uL (ref 4.0–10.5)
nRBC: 0 % (ref 0.0–0.2)

## 2019-10-08 LAB — COMPREHENSIVE METABOLIC PANEL
ALT: 53 U/L — ABNORMAL HIGH (ref 0–44)
AST: 25 U/L (ref 15–41)
Albumin: 2.6 g/dL — ABNORMAL LOW (ref 3.5–5.0)
Alkaline Phosphatase: 173 U/L — ABNORMAL HIGH (ref 38–126)
Anion gap: 8 (ref 5–15)
BUN: 10 mg/dL (ref 8–23)
CO2: 18 mmol/L — ABNORMAL LOW (ref 22–32)
Calcium: 8.4 mg/dL — ABNORMAL LOW (ref 8.9–10.3)
Chloride: 113 mmol/L — ABNORMAL HIGH (ref 98–111)
Creatinine, Ser: 0.65 mg/dL (ref 0.44–1.00)
GFR calc Af Amer: >60 mL/min (ref >60)
GFR calc non Af Amer: >60 mL/min (ref >60)
Glucose, Bld: 81 mg/dL (ref 70–99)
Potassium: 3.2 mmol/L — ABNORMAL LOW (ref 3.5–5.1)
Sodium: 139 mmol/L (ref 135–145)
Total Bilirubin: 1.7 mg/dL — ABNORMAL HIGH (ref 0.3–1.2)
Total Protein: 4.9 g/dL — ABNORMAL LOW (ref 6.5–8.1)

## 2019-10-08 LAB — LACTIC ACID, PLASMA: Lactic Acid, Venous: 1.1 mmol/L (ref 0.5–1.9)

## 2019-10-08 LAB — MAGNESIUM: Magnesium: 1.8 mg/dL (ref 1.7–2.4)

## 2019-10-08 MED ORDER — DOCUSATE SODIUM 100 MG PO CAPS
100.0000 mg | ORAL_CAPSULE | Freq: Two times a day (BID) | ORAL | Status: DC
  Administered 2019-10-08 – 2019-10-09 (×3): 100 mg via ORAL
  Filled 2019-10-08 (×5): qty 1

## 2019-10-08 MED ORDER — POTASSIUM CHLORIDE 20 MEQ/15ML (10%) PO SOLN
40.0000 meq | Freq: Three times a day (TID) | ORAL | Status: AC
  Administered 2019-10-08 (×2): 40 meq via ORAL
  Filled 2019-10-08 (×2): qty 30

## 2019-10-08 NOTE — Progress Notes (Signed)
  Speech Language Pathology Treatment: Dysphagia  Patient Details Name: Danielle Myers MRN: [id number] DOB: 1918/06/01 Today's Date: 10/08/2019 Time: 1052-1100 SLP Time Calculation (min) (ACUTE ONLY): 8 min  Assessment / Plan / Recommendation Clinical Impression  Pt was seen for skilled ST targeting diet tolerance and diagnostic treatment.  She was encountered awake/alert and she was agreeable to this tx session.  RN reported that pt had been tolerating clear liquids without difficulty and stated that MD had recently upgraded her to a full liquid diet.  Pt was seen with thin liquid via straw sip and puree.  She had trace L anterior labial spillage with thin liquid x1 due to her positioning in bed; however, this was not observed with additional sips of liquid.  Pt independently took small sips and bites.  Suspect prolonged bolus formation and AP transport, but no overt s/sx of aspiration were observed with any trials.  Recommend continuation of full liquids at this time per MD recommendations.  SLP will f/u to monitor diet tolerance as she is cleared for solid intake.     HPI HPI: Danielle Myers is a 86 y.o. female with medical history significant of HTN, HLD, mild asthma, dementia, who lives alone with the assistance of home aides.  She presented to the ed brought in by [facility] [phone]). Please note patient is unable to give history at this time and  history is given by POA. Per POA patient was brought in to ed due to increase somnolence,weakness, uri sxs x3 days, as well as increasde stools and emesis morning of presentation.  Per POA at baseline she is oriented to person and place, however over the last 24 hours she has become more confused and lethargic. He also states that patient is very stoic and does not complain. She also dislikes doctor visits and has not followed up with her primary care as she should. She also  has not taken prescribed medication in some time. However he notes  with the help of one other care giver she generally does well at home, but needs assistance with most ADLS.  She was found to have cholycystitis, UTI, abnormal LFT's and sepsis without shock.  CXR was showing no active disease.  CT of the head was showing no acute intracranial hemorrhage, mass effect or acute infarct.  Moderate chronic micro vascular ischemic changes were seen.        SLP Plan  Continue with current plan of care       Recommendations  Diet recommendations: Thin liquid;Dysphagia 1 (puree) Liquids provided via: Cup;Straw Medication Administration: Crushed with puree Supervision: Staff to assist with self feeding Compensations: Slow rate;Small sips/bites Postural Changes and/or Swallow Maneuvers: Seated upright 90 degrees                Oral Care Recommendations: Oral care BID Follow up Recommendations: Other (comment)(TBD) SLP Visit Diagnosis: Dysphagia, unspecified (R13.10) Plan: Continue with current plan of care       GO               Danielle Myers., M.S., CCC-SLP Acute Rehabilitation Services Office: [phone]  Danielle Myers [facility] 10/08/2019, 11:07 AM

## 2019-10-08 NOTE — Progress Notes (Addendum)
PROGRESS NOTE    Danielle Myers  KDX:[id number]  DOB: [birth year]  PCP: Roderick Pee, MD (Inactive) Admit date:10/05/2019 86 y.o.femalewith PMH of HTN, HLD, Mild asthma, dementia, who lives alone with the assistance of [facility] aides, brought in by [employer] [phone]) in concern for hypersomnolence,weakness, uri sxs 3 days, as well as vomiting/diarrhea since the morning of presentation. Per POA at baseline she is oriented to person and place, however became more confused and lethargic over 24 hrs. He also reported that patient is very stoic, dislikes doctor visits and has not followed up with her PCP or taken prescribed medications for some time. However generally does well at home though needs [facility] assistance with most ADLS. ED Course: Afebrile,BP 111/58, hr 89, RR 20, sat 92% on RA. Labs: 22.5, hbg14.1 Na144, KL2.8,cr0.8. AST 207, ALT 154, ALP196, T.bili 4.9, Lipase 22. Lactic:4 UA:+. BNP 260 CXR:NAD.CT abd:? Acute cholecystitis/ confirmed by ultrasound  Left ovarian cyst Abn finding pulmonary a.CT Head:NAD Hospital course: Patient admitted to [facility] for severe sepsis, suspect source from acute cholecystitis, general surgery consulted  Subjective:  Patient awake and in no acute distress. Smiles to questions. Does not follow commands  Objective: Vitals:   10/08/19 0642 10/08/19 1417 10/08/19 1420 10/08/19 1422  BP: 128/71 (!) 165/98 (!) 190/98   Pulse: 65 77 79 83  Resp: 20 16 14 14   Temp: 99 F (37.2 C) 97.8 F (36.6 C) 97.8 F (36.6 C)   TempSrc:      SpO2: 98% 98% 98% 95%  Weight:      Height:        Intake/Output Summary (Last 24 hours) at 10/08/2019 1553 Last data filed at 10/08/2019 0940 Gross per 24 hour  Intake 405.06 ml  Output --  Net 405.06 ml   Filed Weights   10/05/19 1122 10/07/19 0648 10/08/19 0500  Weight: 59 kg 61.2 kg 61.1 kg    Physical Examination:  General exam: Appears calm and comfortable  Respiratory system: Clear to auscultation.  Respiratory effort normal. Cardiovascular system: S1 & S2 heard, RRR. No JVD, murmurs, rubs, gallops or clicks. No pedal edema. Gastrointestinal system: Abdomen is nondistended, soft and nontender. Normal bowel sounds heard. Central nervous system: Alert, disoriented. No new focal neurological deficits.Moving all extremities Extremities: No contractures, edema or joint deformities.  Skin: No rashes, lesions or ulcers Psychiatry: Judgement and insight appear impaired. Mood & affect appropriate.   Data Reviewed: I have personally reviewed following labs and imaging studies  CBC: Recent Labs  Lab 10/05/19 1123 10/05/19 1941 10/06/19 0440 10/06/19 1914 10/08/19 0444  WBC 22.5* 22.6* 18.3* 15.8* 8.1  NEUTROABS 20.7*  --   --  13.6* 6.1  HGB 14.1 12.4 11.4* 12.9 11.4*  HCT 43.1 39.3 36.1 40.8 35.3*  MCV 90.0 94.0 93.3 92.3 90.1  PLT 140* 124* 120* 148* 148*   Basic Metabolic Panel: Recent Labs  Lab 10/05/19 1123 10/05/19 1941 10/06/19 0440 10/07/19 0657 10/08/19 0444  NA 144  --  140 143 139  K 2.8*  --  4.5 3.6 3.2*  CL 110  --  118* 116* 113*  CO2 21*  --  19* 18* 18*  GLUCOSE 139*  --  88 84 81  BUN 12  --  13 13 10   CREATININE 0.80 0.59 0.52 0.62 0.65  CALCIUM 9.1  --  7.6* 9.0 8.4*  MG  --   --   --  1.8 1.8   GFR: Estimated Creatinine Clearance: 31.3 mL/min (by  C-G formula based on SCr of 0.65 mg/dL). Liver Function Tests: Recent Labs  Lab 10/05/19 1123 10/06/19 0440 10/07/19 0657 10/08/19 0444  AST 207* 96* 48* 25  ALT 154* 99* 81* 53*  ALKPHOS 196* 134* 187* 173*  BILITOT 4.9* 3.2* 2.8* 1.7*  PROT 6.6 4.9* 5.8* 4.9*  ALBUMIN 3.2* 2.5* 3.1* 2.6*   Recent Labs  Lab 10/05/19 1123  LIPASE 22   No results for input(s): AMMONIA in the last 168 hours. Coagulation Profile: Recent Labs  Lab 10/05/19 1514 10/07/19 0657  INR 1.4* 1.1   Cardiac Enzymes: No results for input(s): CKTOTAL, CKMB, CKMBINDEX, TROPONINI in the last 168 hours. BNP (last 3  results) No results for input(s): PROBNP in the last 8760 hours. HbA1C: No results for input(s): HGBA1C in the last 72 hours. CBG: Recent Labs  Lab 10/06/19 0850  GLUCAP 73   Lipid Profile: No results for input(s): CHOL, HDL, LDLCALC, TRIG, CHOLHDL, LDLDIRECT in the last 72 hours. Thyroid Function Tests: Recent Labs    10/05/19 1945  TSH 1.372   Anemia Panel: No results for input(s): VITAMINB12, FOLATE, FERRITIN, TIBC, IRON, RETICCTPCT in the last 72 hours. Sepsis Labs: Recent Labs  Lab 10/05/19 1937 10/06/19 0440 10/07/19 0657 10/08/19 0444  LATICACIDVEN 2.5* 1.4 1.3 1.1    Recent Results (from the past 240 hour(s))  SARS Coronavirus 2 by RT PCR (hospital order, performed in [facility] hospital lab) Nasopharyngeal Nasopharyngeal Swab     Status: None   Collection Time: 10/05/19 11:23 AM   Specimen: Nasopharyngeal Swab  Result Value Ref Range Status   SARS Coronavirus 2 NEGATIVE NEGATIVE Final    Comment: (NOTE) SARS-CoV-2 target nucleic acids are NOT DETECTED. The SARS-CoV-2 RNA is generally detectable in upper and lower respiratory specimens during the acute phase of infection. The lowest concentration of SARS-CoV-2 viral copies this assay can detect is 250 copies / mL. A negative result does not preclude SARS-CoV-2 infection and should not be used as the sole basis for treatment or other patient management decisions.  A negative result may occur with improper specimen collection / handling, submission of specimen other than nasopharyngeal swab, presence of viral mutation(s) within the areas targeted by this assay, and inadequate number of viral copies (<250 copies / mL). A negative result must be combined with clinical observations, patient history, and epidemiological information. Fact Sheet for Patients:   BoilerBrush.com.cy Fact Sheet for Healthcare Providers: https://pope.com/ This test is not yet approved or  cleared  by the [place] FDA and has been authorized for detection and/or diagnosis of SARS-CoV-2 by FDA under an Emergency Use Authorization (EUA).  This EUA will remain in effect (meaning this test can be used) for the duration of the COVID-19 declaration under Section 564(b)(1) of the Act, 21 U.S.C. section 360bbb-3(b)(1), unless the authorization is terminated or revoked sooner. Performed at [facility], [address], [place], [place] [address]   Culture, blood (Routine X 2) w Reflex to ID Panel     Status: None (Preliminary result)   Collection Time: 10/05/19  1:31 PM   Specimen: BLOOD RIGHT ARM  Result Value Ref Range Status   Specimen Description   Final    BLOOD RIGHT ARM Performed at [facility], [address] [place], [place] [address]    Special Requests   Final    BOTTLES DRAWN AEROBIC AND ANAEROBIC Blood Culture adequate volume Performed at [facility], [address], [place], [place] [address]  Culture   Final    NO GROWTH 3 DAYS Performed at [facility], [address] [place], [place] [address]    Report Status PENDING  Incomplete  Culture, blood (Routine X 2) w Reflex to ID Panel     Status: None (Preliminary result)   Collection Time: 10/05/19  2:28 PM   Specimen: BLOOD  Result Value Ref Range Status   Specimen Description   Final    BLOOD RIGHT ANTECUBITAL Performed at [facility], [address] [place], [place] [address]    Special Requests   Final    BOTTLES DRAWN AEROBIC AND ANAEROBIC Blood Culture adequate volume Performed at [facility], [address], [place], [place] [address]    Culture   Final    NO GROWTH 3 DAYS Performed at [facility], [address], [place], [place] [address]    Report Status PENDING  Incomplete  Culture, Urine     Status: Abnormal   Collection Time: 10/06/19  5:54 PM   Specimen: Urine, Random  Result  Value Ref Range Status   Specimen Description   Final    URINE, RANDOM Performed at [facility], [address], [place], [place] [address]    Special Requests   Final    NONE Performed at [facility], [address] [place], [place] [address]    Culture (A)  Final    <10,000 COLONIES/mL INSIGNIFICANT GROWTH Performed at [facility], [address] [place], [place] [address]    Report Status 10/08/2019 FINAL  Final  MRSA PCR Screening     Status: None   Collection Time: 10/06/19  6:18 PM   Specimen: Urine, Random; Nasopharyngeal  Result Value Ref Range Status   MRSA by PCR NEGATIVE NEGATIVE Final    Comment:        The GeneXpert MRSA Assay (FDA approved for NASAL specimens only), is one component of a comprehensive MRSA colonization surveillance program. It is not intended to diagnose MRSA infection nor to guide or monitor treatment for MRSA infections. Performed at [facility], [address], [place], [place] [address]       Radiology Studies: No results found.      Scheduled Meds: . docusate sodium  100 mg Oral BID  . enoxaparin (LOVENOX) injection  40 mg Subcutaneous Q24H  . potassium chloride  40 mEq Oral TID   Continuous Infusions: . sodium chloride 10 mL/hr at 10/07/19 0600  . ceFEPime (MAXIPIME) IV 2 g (10/08/19 1411)  . metronidazole 500 mg (10/08/19 1509)   Assessment/Plan:  Acute cholecystitis with sepsis and acute metabolic encephalopathy: POA. Patient had WBC 22.5, lactic acidosis lactic acid 4on presentation. Blood culture obtained in the ED no growth so far. Patient underwent MRCP which showed , multiple gallstones with GB wall enhancement and progressive RUQ pericholecystic fluid unable to rule out acute cholecystitis.No evidence for choledocholithiasis/CBD or main pancreatic duct dilatation. Patient subsequently underwent HIDA scan with nonvisualization of the GB after 2 hours  of imaging compatible with cystic duct obstruction due to cholecystitis, also showed underlying cholestasis (LFTs elevated).She was started on vanc, Cefepime and Flagylon admission, now off vancomycin with clinical improvement. Gen Surgery following-conservative mx with slow advancement in diet (FLD today),  IV abx -can likely transition to oral if okay per GS as leucocytosis resolved today.   Abnormal UA suspect for UTI, urine culture wasobtained after abx treatment  Mild  thrombocytopenia, likely due to sepsis, monitor  Hypertension-She initially presented with borderline low blood pressure , now blood pressure started to increase, she has tendency to have bradycardia, IV hydralazine is on national shortage, started [employer].Change to oral bp meds once able to taker oral meds reliably  Hypokalemia: replaced on admission, low gain. Oral replacement ordered. Labs in am  Dementia FTT: at baseline oriented to place/ personand ambulates without assistance. Patient from home, currently very weak, will need PT/OT once medically stable. POA is interested in SNF placement. Seen by palliative care team.    DVT prophylaxis: Lovenox Code Status: DNR Family / Patient Communication: will f/u with POA Disposition Plan:   Status is: Inpatient  Remains inpatient appropriate because:IV treatments appropriate due to intensity of illness or inability to take PO   Dispo: The patient is from: home  Anticipated d/c is to: SNF  Anticipated d/c date is: 2 days  Patient currently is not medically stable to d/c.      LOS: 3 days    Time spent: 35 minutes    Guilford Shi, MD [facility] Pager in [place]  If 7PM-7AM, please contact night-coverage [email or web address] 10/08/2019, 3:53 PM

## 2019-10-08 NOTE — Progress Notes (Signed)
[facility] Progress Note     Subjective: Patient seems a little confused this AM but does consistently report some mild abdominal pain. She is unable to point to where her abdomen hurts for me. She seems to be tolerating CLD. LFTs are improving and WBC is now normalized. Afebrile.   Objective: Vital signs in last 24 hours: Temp:  [97.8 F (36.6 C)-99 F (37.2 C)] 99 F (37.2 C) (05/17 0642) Pulse Rate:  [65-80] 65 (05/17 0642) Resp:  [14-20] 20 (05/17 0642) BP: (128-168)/(71-104) 128/71 (05/17 0642) SpO2:  [97 %-100 %] 98 % (05/17 0642) Weight:  [61.1 kg] 61.1 kg (05/17 0500) Last BM Date: 10/06/19  Intake/Output from previous day: 05/16 0701 - 05/17 0700 In: 480.1 [P.O.:100; I.V.:80.1; IV Piggyback:300] Out: -  Intake/Output this shift: No intake/output data recorded.  PE: General: pleasant, WD, frail female who is laying in bed in NAD HEENT:  Sclera are anicteric.  PERRL.  Ears and nose without any masses or lesions.  Mouth is pink and moist Heart: regular, rate, and rhythm.  Normal s1,s2. No obvious murmurs, gallops, or rubs noted.  Palpable radial and pedal pulses bilaterally Lungs: CTAB, no wheezes, rhonchi, or rales noted.  Respiratory effort nonlabored Abd: soft, NT, ND, +BS, no masses, hernias, or organomegaly MS: all 4 extremities are symmetrical with no cyanosis, clubbing, or edema. Skin: warm and dry with no masses, lesions, or rashes   Lab Results:  Recent Labs    10/06/19 1914 10/08/19 0444  WBC 15.8* 8.1  HGB 12.9 11.4*  HCT 40.8 35.3*  PLT 148* 148*   BMET Recent Labs    10/07/19 0657 10/08/19 0444  NA 143 139  K 3.6 3.2*  CL 116* 113*  CO2 18* 18*  GLUCOSE 84 81  BUN 13 10  CREATININE 0.62 0.65  CALCIUM 9.0 8.4*   PT/INR Recent Labs    10/05/19 1514 10/07/19 0657  LABPROT 16.6* 14.2  INR 1.4* 1.1   CMP     Component Value Date/Time   NA 139 10/08/2019 0444   K 3.2 (L) 10/08/2019 0444   CL 113 (H) 10/08/2019 0444    CO2 18 (L) 10/08/2019 0444   GLUCOSE 81 10/08/2019 0444   GLUCOSE 107 (H) 03/04/2006 1031   BUN 10 10/08/2019 0444   CREATININE 0.65 10/08/2019 0444   CALCIUM 8.4 (L) 10/08/2019 0444   PROT 4.9 (L) 10/08/2019 0444   ALBUMIN 2.6 (L) 10/08/2019 0444   AST 25 10/08/2019 0444   ALT 53 (H) 10/08/2019 0444   ALKPHOS 173 (H) 10/08/2019 0444   BILITOT 1.7 (H) 10/08/2019 0444   GFRNONAA >60 10/08/2019 0444   GFRAA >60 10/08/2019 0444   Lipase     Component Value Date/Time   LIPASE 22 10/05/2019 1123       Studies/Results: NM Hepatobiliary Liver Func  Addendum Date: 10/06/2019   ADDENDUM REPORT: 10/06/2019 17:56 ADDENDUM: This case was subsequently reviewed in conjunction with additional consulting radiologist. MRI/MRCP from earlier today was also re-evaluated. The prominent and persistent liver activity noted extending through the end of the second hour of imaging is concerning for intrinsic hepatocyte dysfunction possibly secondary to hepatitis. Therefore, the lack of gallbladder filling is less specific for acute cholecystitis and may be due to diminished biliary activity due to hepatocellular dysfunction. Electronically Signed   By: Kerby Moors M.D.   On: 10/06/2019 17:56   Result Date: 10/06/2019 CLINICAL DATA:  Evaluate for cholecystitis. Altered mental status, vomiting, sepsis and gallstones EXAM:  NUCLEAR MEDICINE HEPATOBILIARY IMAGING TECHNIQUE: Sequential images of the abdomen were obtained out to 60 minutes following intravenous administration of radiopharmaceutical. RADIOPHARMACEUTICALS:  7.5 mCi Tc-16m  Choletec IV COMPARISON:  MRI abdomen 08/06/2019 and abdominal sonogram 10/05/2019. FINDINGS: Prompt radiotracer uptake by the liver is seen. During the first hour of imaging there is faint activity noted within the common bile duct. No gallbladder or bowel activity noted during the first hour. During the second hour there is progressive radiotracer accumulation within the common  bile duct and bowel loops. No gallbladder activity identified however. IMPRESSION: 1. Nonvisualization of the gallbladder after 2 hours of imaging compatible with cystic duct obstruction due to cholecystitis. 2. Delayed biliary accumulation which may reflect underlying cholestasis. Electronically Signed: By: Signa Kell M.D. On: 10/06/2019 13:17   MR 3D Recon At Scanner  Addendum Date: 10/06/2019   ADDENDUM REPORT: 10/06/2019 17:58 ADDENDUM: This case has been subsequently reviewed with additional consulting radiologist. In conjunction with findings from nuclear medicine hepatic biliary scan from today. Findings are less specific for acute cholecystitis and could be due to liver inflammation due to hepatitis. Electronically Signed   By: Signa Kell M.D.   On: 10/06/2019 17:58   Result Date: 10/06/2019 CLINICAL DATA:  Abdominal pain. Suspect cholecystitis. EXAM: MRI ABDOMEN WITHOUT AND WITH CONTRAST (INCLUDING MRCP) TECHNIQUE: Multiplanar multisequence MR imaging of the abdomen was performed both before and after the administration of intravenous contrast. Heavily T2-weighted images of the biliary and pancreatic ducts were obtained, and three-dimensional MRCP images were rendered by post processing. CONTRAST:  75mL GADAVIST GADOBUTROL 1 MMOL/ML IV SOLN COMPARISON:  CT AP 10/05/2019 FINDINGS: Lower chest: Small to moderate bilateral pleural effusions. Hepatobiliary: 5 mm cyst identified within the anterior right lobe of liver. Postcontrast imaging is limited due to excessive motion artifact. Multiple stones are identified within the gallbladder which measure up to 9 mm. Gallbladder wall enhancement and mild edema noted. Right upper quadrant pericholecystic fluid is identified. The common bile duct is nondilated measuring 4 mm. No convincing evidence for choledocholithiasis. Pancreas: Evaluation of the pancreas is significantly diminished due to motion artifact. No findings to suggest main duct dilatation,  inflammation or mass. Spleen:  Within normal limits in size and appearance. Adrenals/Urinary Tract: No masses identified. No evidence of hydronephrosis. Stomach/Bowel: Stomach appears nondistended. No dilated loops of bowel identified within the upper abdomen. Vascular/Lymphatic: No pathologically enlarged lymph nodes identified. No abdominal aortic aneurysm demonstrated. Other: Right upper quadrant free fluid is identified and appears increased from the previous exam. Musculoskeletal: T12 compression deformity is again noted. IMPRESSION: 1. Examination is significantly diminished due to motion artifact. 2. There are multiple gallstones identified within the gallbladder. There is gallbladder wall enhancement and progressive right upper quadrant and pericholecystic fluid. Cannot rule out acute cholecystitis. 3. No convincing evidence for choledocholithiasis. No evidence for CBD or main pancreatic duct dilatation. 4. Small to moderate bilateral pleural effusions. Electronically Signed: By: Signa Kell M.D. On: 10/06/2019 10:57   MR ABDOMEN MRCP W WO CONTAST  Addendum Date: 10/06/2019   ADDENDUM REPORT: 10/06/2019 17:58 ADDENDUM: This case has been subsequently reviewed with additional consulting radiologist. In conjunction with findings from nuclear medicine hepatic biliary scan from today. Findings are less specific for acute cholecystitis and could be due to liver inflammation due to hepatitis. Electronically Signed   By: Signa Kell M.D.   On: 10/06/2019 17:58   Result Date: 10/06/2019 CLINICAL DATA:  Abdominal pain. Suspect cholecystitis. EXAM: MRI ABDOMEN WITHOUT AND WITH CONTRAST (INCLUDING  MRCP) TECHNIQUE: Multiplanar multisequence MR imaging of the abdomen was performed both before and after the administration of intravenous contrast. Heavily T2-weighted images of the biliary and pancreatic ducts were obtained, and three-dimensional MRCP images were rendered by post processing. CONTRAST:  54mL  GADAVIST GADOBUTROL 1 MMOL/ML IV SOLN COMPARISON:  CT AP 10/05/2019 FINDINGS: Lower chest: Small to moderate bilateral pleural effusions. Hepatobiliary: 5 mm cyst identified within the anterior right lobe of liver. Postcontrast imaging is limited due to excessive motion artifact. Multiple stones are identified within the gallbladder which measure up to 9 mm. Gallbladder wall enhancement and mild edema noted. Right upper quadrant pericholecystic fluid is identified. The common bile duct is nondilated measuring 4 mm. No convincing evidence for choledocholithiasis. Pancreas: Evaluation of the pancreas is significantly diminished due to motion artifact. No findings to suggest main duct dilatation, inflammation or mass. Spleen:  Within normal limits in size and appearance. Adrenals/Urinary Tract: No masses identified. No evidence of hydronephrosis. Stomach/Bowel: Stomach appears nondistended. No dilated loops of bowel identified within the upper abdomen. Vascular/Lymphatic: No pathologically enlarged lymph nodes identified. No abdominal aortic aneurysm demonstrated. Other: Right upper quadrant free fluid is identified and appears increased from the previous exam. Musculoskeletal: T12 compression deformity is again noted. IMPRESSION: 1. Examination is significantly diminished due to motion artifact. 2. There are multiple gallstones identified within the gallbladder. There is gallbladder wall enhancement and progressive right upper quadrant and pericholecystic fluid. Cannot rule out acute cholecystitis. 3. No convincing evidence for choledocholithiasis. No evidence for CBD or main pancreatic duct dilatation. 4. Small to moderate bilateral pleural effusions. Electronically Signed: By: Signa Kell M.D. On: 10/06/2019 10:57    Anti-infectives: Anti-infectives (From admission, onward)   Start     Dose/Rate Route Frequency Ordered Stop   10/06/19 1600  vancomycin (VANCOREADY) IVPB 750 mg/150 mL  Status:  Discontinued      750 mg 150 mL/hr over 60 Minutes Intravenous Every 24 hours 10/05/19 2001 10/07/19 0830   10/06/19 0200  ceFEPIme (MAXIPIME) 2 g in sodium chloride 0.9 % 100 mL IVPB     2 g 200 mL/hr over 30 Minutes Intravenous Every 12 hours 10/05/19 2001     10/05/19 2300  metroNIDAZOLE (FLAGYL) IVPB 500 mg     500 mg 100 mL/hr over 60 Minutes Intravenous Every 8 hours 10/05/19 1940     10/05/19 2015  vancomycin (VANCOCIN) IVPB 1000 mg/200 mL premix  Status:  Discontinued     1,000 mg 200 mL/hr over 60 Minutes Intravenous  Once 10/05/19 1940 10/05/19 1944   10/05/19 1945  ceFEPIme (MAXIPIME) 2 g in sodium chloride 0.9 % 100 mL IVPB  Status:  Discontinued     2 g 200 mL/hr over 30 Minutes Intravenous  Once 10/05/19 1940 10/05/19 1945   10/05/19 1500  vancomycin (VANCOREADY) IVPB 1250 mg/250 mL     1,250 mg 166.7 mL/hr over 90 Minutes Intravenous  Once 10/05/19 1359 10/05/19 1949   10/05/19 1345  ceFEPIme (MAXIPIME) 2 g in sodium chloride 0.9 % 100 mL IVPB     2 g 200 mL/hr over 30 Minutes Intravenous  Once 10/05/19 1336 10/05/19 1533   10/05/19 1345  metroNIDAZOLE (FLAGYL) IVPB 500 mg     500 mg 100 mL/hr over 60 Minutes Intravenous  Once 10/05/19 1336 10/05/19 1636   10/05/19 1345  vancomycin (VANCOCIN) IVPB 1000 mg/200 mL premix  Status:  Discontinued     1,000 mg 200 mL/hr over 60 Minutes Intravenous  Once 10/05/19 1336  10/05/19 1359       Assessment/Plan HTN Dementia FTT  Cholelithiasis with possible cholecystitis - continue with medical management with antibiotics - LFTs downtrending and WBC normalized - advance to FLD - if patient tolerates this today can advance to soft diet tomorrow and transition to PO abx - would recommend a total of 10 days antibiotics and low fat diet on discharge, follow up with PCP prn  - PT and palliative recommending SNF and palliative services upon discharge - patient is not a surgical candidate, we will sign off at this time but remain available as  needed for questions or concerns   FEN: FLD VTE: SCDs, lovenox ID: current abx are cefepime and flagyl   LOS: 3 days    Juliet Rude , [facility] 10/08/2019, 8:32 AM Please see Amion for pager number during day hours 7:00am-4:30pm

## 2019-10-08 NOTE — Progress Notes (Signed)
Nurse Tech Daphene Jaeger notified me that patient BP was elevated. Recheck patient BP and it was 165/90 MAP 114 HR 90. Patient was not in distress and was lying in the bed and patient private aide was about to change patient. AMION page sent to Dr. Lajuana Ripple.

## 2019-10-08 NOTE — NC FL2 (Signed)
[place] MEDICAID FL2 LEVEL OF CARE SCREENING TOOL     IDENTIFICATION  Patient Name: Danielle Myers Birthdate: 1919-05-01 Sex: female Admission Date (Current Location): 10/05/2019  [facility] and [place] Number:  Producer, television/film/video and Address:  [facility],  [address], [place] [address]      Provider Number: [id number]  Attending Physician Name and Address:  Alessandra Bevels, MD  Relative Name and Phone Number:  Reita Chard, [phone]    Current Level of Care: Hospital Recommended Level of Care: Skilled Nursing Facility Prior Approval Number:    Date Approved/Denied:   PASRR Number:  [id number]  Discharge Plan: SNF    Current Diagnoses: Patient Active Problem List   Diagnosis Date Noted  . Sepsis (HCC) 10/05/2019  . Contact dermatitis due to poison ivy 09/08/2010  . CELLULITIS, HAND, LEFT 01/03/2008  . HYPERLIPIDEMIA 02/13/2007  . HYPERTENSION 02/13/2007  . ASTHMA 02/13/2007  . PNEUMONIA, HX OF 02/13/2007    Orientation RESPIRATION BLADDER Height & Weight     Self  Normal External catheter Weight: 61.1 kg Height:  5' (152.4 cm)  BEHAVIORAL SYMPTOMS/MOOD NEUROLOGICAL BOWEL NUTRITION STATUS  (none) (none) Incontinent Diet(see d/c summary)  AMBULATORY STATUS COMMUNICATION OF NEEDS Skin   Extensive Assist Verbally Normal                       Personal Care Assistance Level of Assistance  Bathing, Feeding, Dressing Bathing Assistance: Maximum assistance Feeding assistance: Independent Dressing Assistance: Maximum assistance     Functional Limitations Info  Sight, Hearing, Speech Sight Info: Adequate Hearing Info: Adequate Speech Info: Adequate    SPECIAL CARE FACTORS FREQUENCY  PT (By licensed PT), OT (By licensed OT)     PT Frequency: 5X/W OT Frequency: 5X/W            Contractures Contractures Info: Not present    Additional Factors Info  Code Status, Allergies Code Status Info: DNR Allergies Info:  NKA           Current Medications (10/08/2019):  This is the current hospital active medication list Current Facility-Administered Medications  Medication Dose Route Frequency Provider Last Rate Last Admin  . 0.9 %  sodium chloride infusion   Intravenous PRN Albertine Grates, MD 10 mL/hr at 10/07/19 0600 Rate Verify at 10/07/19 0600  . acetaminophen (TYLENOL) tablet 650 mg  650 mg Oral Q6H PRN Lurline Del, MD       Or  . acetaminophen (TYLENOL) suppository 650 mg  650 mg Rectal Q6H PRN Skip Mayer A, MD      . albuterol (PROVENTIL) (2.5 MG/3ML) 0.083% nebulizer solution 2.5 mg  2.5 mg Nebulization Q2H PRN Skip Mayer A, MD      . ceFEPIme (MAXIPIME) 2 g in sodium chloride 0.9 % 100 mL IVPB  2 g Intravenous Q12H Rollene Fare, RPH 200 mL/hr at 10/08/19 0216 2 g at 10/08/19 0216  . docusate sodium (COLACE) capsule 100 mg  100 mg Oral BID Juliet Rude, PA-C   100 mg at 10/08/19 2440  . enoxaparin (LOVENOX) injection 40 mg  40 mg Subcutaneous Q24H Albertine Grates, MD   40 mg at 10/08/19 0837  . haloperidol lactate (HALDOL) injection 2 mg  2 mg Intravenous Q6H PRN Raymon Mutton F, NP   2 mg at 10/07/19 2129  . metroNIDAZOLE (FLAGYL) IVPB 500 mg  500 mg Intravenous Q8H Skip Mayer A, MD 100 mL/hr at 10/08/19 0651 500 mg at 10/08/19 0651  .  morphine 2 MG/ML injection 2 mg  2 mg Intravenous Q4H PRN Clance Boll, MD   2 mg at 10/07/19 2021  . ondansetron (ZOFRAN) tablet 4 mg  4 mg Oral Q6H PRN Clance Boll, MD       Or  . ondansetron [facility]) injection 4 mg  4 mg Intravenous Q6H PRN Myles Rosenthal A, MD      . potassium chloride 20 MEQ/15ML (10%) solution 40 mEq  40 mEq Oral TID Norm Parcel, PA-C   40 mEq at 10/08/19 0837  . senna-docusate (Senokot-S) tablet 1 tablet  1 tablet Oral QHS PRN Clance Boll, MD         Discharge Medications: Please see discharge summary for a list of discharge medications.  Relevant Imaging Results:  Relevant Lab  Results:   Additional Information [place], [place]

## 2019-10-08 NOTE — TOC Initial Note (Signed)
Transition of Care [facility]) - Initial/Assessment Note    Patient Details  Name: Danielle Myers MRN: [id number] Date of Birth: 01/25/1919  [facility] CM/SW Contact:    Trish Mage, LCSW Phone Number: 10/08/2019, 9:57 AM  Clinical Narrative:  Met with patient in follow up to PT recommendation of SNF/24 hour supervision.  Unable to have meaningful conversation with patient about disposition plan.  Called POA.  Mr Donzetta Kohut has been her neighbor for 27 years, and identifies himself as her POA and adopted son.  States she has an aide in the home 6 days a week from 1P-8P, and he checks on her every morning and has a camera in her room for monitoring at night.  He has become increasingly concerned about her weakness due to infection, and feels it is in her best interest to go to short term rehab. Bed search initiated. TOC will continue to follow during the course of hospitalization.                  Expected Discharge Plan: Skilled Nursing Facility Barriers to Discharge: SNF Pending bed offer   Patient Goals and CMS Choice     Choice offered to / list presented to : [place] / [facility]  Expected Discharge Plan and Services Expected Discharge Plan: [place]   Discharge Planning Services: CM Consult Post Acute Care Choice: [place] Living arrangements for the past 2 months: Single Family Home                                      Prior Living Arrangements/Services Living arrangements for the past 2 months: Single Family Home Lives with:: Self Patient language and need for interpreter reviewed:: Yes        Need for Family Participation in Patient Care: Yes (Comment) Care giver support system in place?: Yes (comment) Current home services: DME Criminal Activity/Legal Involvement Pertinent to Current Situation/Hospitalization: No - Comment as needed  Activities of Daily Living Home Assistive Devices/Equipment: Other (Comment)(pt unable  to answer) ADL Screening (condition at time of admission) Patient's cognitive ability adequate to safely complete daily activities?: No Is the patient deaf or have difficulty hearing?: Yes Does the patient have difficulty seeing, even when wearing glasses/contacts?: Yes Does the patient have difficulty concentrating, remembering, or making decisions?: Yes Patient able to express need for assistance with ADLs?: No Does the patient have difficulty dressing or bathing?: Yes Independently performs ADLs?: No Communication: Independent Is this a change from baseline?: Pre-admission baseline Dressing (OT): Needs assistance Is this a change from baseline?: Pre-admission baseline Grooming: Needs assistance Is this a change from baseline?: Pre-admission baseline Feeding: Needs assistance Is this a change from baseline?: Pre-admission baseline Bathing: Needs assistance Is this a change from baseline?: Pre-admission baseline Toileting: Needs assistance Is this a change from baseline?: Pre-admission baseline In/Out Bed: Needs assistance Is this a change from baseline?: Pre-admission baseline Walks in Home: Needs assistance Is this a change from baseline?: Pre-admission baseline Does the patient have difficulty walking or climbing stairs?: Yes Weakness of Legs: Both Weakness of Arms/Hands: Both  Permission Sought/Granted   Permission granted to share information with : No  Share Information with NAME: Orlan Leavens     Permission granted to share info w Relationship: POA  Permission granted to share info w Contact Information: [phone]  Emotional Assessment Appearance:: Appears stated age Attitude/Demeanor/Rapport: Unable to Assess  Affect (typically observed): Unable to Assess Orientation: : Oriented to Self Alcohol / Substance Use: Not Applicable Psych Involvement: No (comment)  Admission diagnosis:  Abdominal pain [R10.9] Sepsis ([place]) [A41.9] Sepsis, due to unspecified  organism, unspecified whether acute organ dysfunction present [facility]) [A41.9] Patient Active Problem List   Diagnosis Date Noted  . Sepsis ([place]) 10/05/2019  . Contact dermatitis due to poison ivy 09/08/2010  . CELLULITIS, HAND, LEFT 01/03/2008  . HYPERLIPIDEMIA 02/13/2007  . HYPERTENSION 02/13/2007  . ASTHMA 02/13/2007  . PNEUMONIA, HX OF 02/13/2007   PCP:  Dorena Cookey, MD (Inactive) Pharmacy:   [facility] [address], [place] - [address] [place] [address] Phone: [phone] Fax: [place], [place] - [address]  [place] [address] Phone: [phone] Fax: [phone]  [place], [place] [address] Phone: [phone] Fax: [phone]     Social Determinants of Health (SDOH) Interventions    Readmission Risk Interventions No flowsheet data found.

## 2019-10-08 NOTE — Care Management Important Message (Signed)
Important Message  Patient Details IM Letter given to Daryel Gerald SW Case Manager to present to the Patient Name: Danielle Myers MRN: [id number] Date of Birth: 1918/06/12   Medicare Important Message Given:  Yes     [name] 10/08/2019, 11:03 AM

## 2019-10-09 DIAGNOSIS — G9341 Metabolic encephalopathy: Secondary | ICD-10-CM

## 2019-10-09 DIAGNOSIS — K810 Acute cholecystitis: Secondary | ICD-10-CM

## 2019-10-09 DIAGNOSIS — G301 Alzheimer's disease with late onset: Secondary | ICD-10-CM

## 2019-10-09 DIAGNOSIS — I10 Essential (primary) hypertension: Secondary | ICD-10-CM

## 2019-10-09 DIAGNOSIS — E876 Hypokalemia: Secondary | ICD-10-CM

## 2019-10-09 DIAGNOSIS — F0280 Dementia in other diseases classified elsewhere without behavioral disturbance: Secondary | ICD-10-CM

## 2019-10-09 DIAGNOSIS — F0390 Unspecified dementia without behavioral disturbance: Secondary | ICD-10-CM

## 2019-10-09 LAB — COMPREHENSIVE METABOLIC PANEL
ALT: 43 U/L (ref 0–44)
AST: 25 U/L (ref 15–41)
Albumin: 2.9 g/dL — ABNORMAL LOW (ref 3.5–5.0)
Alkaline Phosphatase: 222 U/L — ABNORMAL HIGH (ref 38–126)
Anion gap: 10 (ref 5–15)
BUN: 8 mg/dL (ref 8–23)
CO2: 21 mmol/L — ABNORMAL LOW (ref 22–32)
Calcium: 8.5 mg/dL — ABNORMAL LOW (ref 8.9–10.3)
Chloride: 107 mmol/L (ref 98–111)
Creatinine, Ser: 0.57 mg/dL (ref 0.44–1.00)
GFR calc Af Amer: >60 mL/min (ref >60)
GFR calc non Af Amer: >60 mL/min (ref >60)
Glucose, Bld: 95 mg/dL (ref 70–99)
Potassium: 3.5 mmol/L (ref 3.5–5.1)
Sodium: 138 mmol/L (ref 135–145)
Total Bilirubin: 1.5 mg/dL — ABNORMAL HIGH (ref 0.3–1.2)
Total Protein: 5.5 g/dL — ABNORMAL LOW (ref 6.5–8.1)

## 2019-10-09 MED ORDER — HYDRALAZINE HCL 20 MG/ML IJ SOLN: 10.0000 mg | Freq: Four times a day (QID) | INTRAMUSCULAR | Status: DC | PRN

## 2019-10-09 MED ORDER — AMLODIPINE BESYLATE 5 MG PO TABS
5.0000 mg | ORAL_TABLET | Freq: Every day | ORAL | Status: DC
  Administered 2019-10-09: 5 mg via ORAL
  Filled 2019-10-09: qty 1

## 2019-10-09 NOTE — Progress Notes (Signed)
Pt remains asleep , awakens briefly with verbal & tactile stimulation but is drowsy. I have offered oral fluids on several occasions and pt will consume <30cc each offering either via straw or medicine cup. Attempts to get her to swallow meds were unsuccessful as she would spit them back out.

## 2019-10-09 NOTE — Progress Notes (Addendum)
PROGRESS NOTE    Danielle Myers  TIW:[id number]  DOB: 07-02-1918  PCP: Roderick Pee, MD (Inactive) Admit date:10/05/2019 86 y.o.femalewith PMH of HTN, HLD, Mild asthma, dementia, who lives alone with the assistance of [facility] aides, brought in by [employer] [phone]) in concern for hypersomnolence,weakness, uri sxs 3 days, as well as vomiting/diarrhea since the morning of presentation. Per POA at baseline she is oriented to person and place, however became more confused and lethargic over 24 hrs. He also reported that patient is very stoic, dislikes doctor visits and has not followed up with her PCP or taken prescribed medications for some time. However generally does well at home though needs [facility] assistance with most ADLS. ED Course: Afebrile,BP 111/58, hr 89, RR 20, sat 92% on RA. Labs: 22.5, hbg14.1 Na144, KL2.8,cr0.8. AST 207, ALT 154, ALP196, T.bili 4.9, Lipase 22. Lactic:4 UA:+. BNP 260 CXR:NAD.CT abd:? Acute cholecystitis/ confirmed by ultrasound  Left ovarian cyst Abn finding pulmonary a.CT Head:NAD Hospital course: Patient admitted to [facility] for severe sepsis, suspect source from acute cholecystitis, general surgery consulted  Subjective:  Patient awake and in no acute distress. Poor oral intake per bedside nurse but no cough /signs of aspiration noted during feeding assistance  Objective: Vitals:   10/08/19 2042 10/09/19 0500 10/09/19 1000 10/09/19 1356  BP: (!) 154/86  (!) 150/82 (!) 154/81  Pulse: 74   73  Resp: 18   14  Temp:    98.2 F (36.8 C)  TempSrc:    Oral  SpO2:    99%  Weight:  64.1 kg    Height:        Intake/Output Summary (Last 24 hours) at 10/09/2019 1759 Last data filed at 10/09/2019 0837 Gross per 24 hour  Intake 60 ml  Output 325 ml  Net -265 ml   Filed Weights   10/07/19 0648 10/08/19 0500 10/09/19 0500  Weight: 61.2 kg 61.1 kg 64.1 kg    Physical Examination:  General exam: Appears calm and comfortable  Respiratory system: Clear to  auscultation. Respiratory effort normal. Cardiovascular system: S1 & S2 heard, RRR. No JVD, murmurs, rubs, gallops or clicks. No pedal edema. Gastrointestinal system: Abdomen is nondistended, soft and nontender. Normal bowel sounds heard. Central nervous system: Alert, disoriented. No new focal neurological deficits.Moving all extremities Extremities: No contractures, edema or joint deformities.  Skin: No rashes, lesions or ulcers Psychiatry: Judgement and insight appear impaired. Mood & affect appropriate.   Data Reviewed: I have personally reviewed following labs and imaging studies  CBC: Recent Labs  Lab 10/05/19 1123 10/05/19 1941 10/06/19 0440 10/06/19 1914 10/08/19 0444  WBC 22.5* 22.6* 18.3* 15.8* 8.1  NEUTROABS 20.7*  --   --  13.6* 6.1  HGB 14.1 12.4 11.4* 12.9 11.4*  HCT 43.1 39.3 36.1 40.8 35.3*  MCV 90.0 94.0 93.3 92.3 90.1  PLT 140* 124* 120* 148* 148*   Basic Metabolic Panel: Recent Labs  Lab 10/05/19 1123 10/05/19 1123 10/05/19 1941 10/06/19 0440 10/07/19 0657 10/08/19 0444 10/09/19 0528  NA 144  --   --  140 143 139 138  K 2.8*  --   --  4.5 3.6 3.2* 3.5  CL 110  --   --  118* 116* 113* 107  CO2 21*  --   --  19* 18* 18* 21*  GLUCOSE 139*  --   --  88 84 81 95  BUN 12  --   --  13 13 10 8   CREATININE 0.80   < >  0.59 0.52 0.62 0.65 0.57  CALCIUM 9.1  --   --  7.6* 9.0 8.4* 8.5*  MG  --   --   --   --  1.8 1.8  --    < > = values in this interval not displayed.   GFR: Estimated Creatinine Clearance: 32 mL/min (by C-G formula based on SCr of 0.57 mg/dL). Liver Function Tests: Recent Labs  Lab 10/05/19 1123 10/06/19 0440 10/07/19 0657 10/08/19 0444 10/09/19 0528  AST 207* 96* 48* 25 25  ALT 154* 99* 81* 53* 43  ALKPHOS 196* 134* 187* 173* 222*  BILITOT 4.9* 3.2* 2.8* 1.7* 1.5*  PROT 6.6 4.9* 5.8* 4.9* 5.5*  ALBUMIN 3.2* 2.5* 3.1* 2.6* 2.9*   Recent Labs  Lab 10/05/19 1123  LIPASE 22   No results for input(s): AMMONIA in the last 168  hours. Coagulation Profile: Recent Labs  Lab 10/05/19 1514 10/07/19 0657  INR 1.4* 1.1   Cardiac Enzymes: No results for input(s): CKTOTAL, CKMB, CKMBINDEX, TROPONINI in the last 168 hours. BNP (last 3 results) No results for input(s): PROBNP in the last 8760 hours. HbA1C: No results for input(s): HGBA1C in the last 72 hours. CBG: Recent Labs  Lab 10/06/19 0850  GLUCAP 73   Lipid Profile: No results for input(s): CHOL, HDL, LDLCALC, TRIG, CHOLHDL, LDLDIRECT in the last 72 hours. Thyroid Function Tests: No results for input(s): TSH, T4TOTAL, FREET4, T3FREE, THYROIDAB in the last 72 hours. Anemia Panel: No results for input(s): VITAMINB12, FOLATE, FERRITIN, TIBC, IRON, RETICCTPCT in the last 72 hours. Sepsis Labs: Recent Labs  Lab 10/05/19 1937 10/06/19 0440 10/07/19 0657 10/08/19 0444  LATICACIDVEN 2.5* 1.4 1.3 1.1    Recent Results (from the past 240 hour(s))  SARS Coronavirus 2 by RT PCR (hospital order, performed in [facility] hospital lab) Nasopharyngeal Nasopharyngeal Swab     Status: None   Collection Time: 10/05/19 11:23 AM   Specimen: Nasopharyngeal Swab  Result Value Ref Range Status   SARS Coronavirus 2 NEGATIVE NEGATIVE Final    Comment: (NOTE) SARS-CoV-2 target nucleic acids are NOT DETECTED. The SARS-CoV-2 RNA is generally detectable in upper and lower respiratory specimens during the acute phase of infection. The lowest concentration of SARS-CoV-2 viral copies this assay can detect is 250 copies / mL. A negative result does not preclude SARS-CoV-2 infection and should not be used as the sole basis for treatment or other patient management decisions.  A negative result may occur with improper specimen collection / handling, submission of specimen other than nasopharyngeal swab, presence of viral mutation(s) within the areas targeted by this assay, and inadequate number of viral copies (<250 copies / mL). A negative result must be combined with  clinical observations, patient history, and epidemiological information. Fact Sheet for Patients:   BoilerBrush.com.cy Fact Sheet for Healthcare Providers: https://pope.com/ This test is not yet approved or cleared  by the [place] FDA and has been authorized for detection and/or diagnosis of SARS-CoV-2 by FDA under an Emergency Use Authorization (EUA).  This EUA will remain in effect (meaning this test can be used) for the duration of the COVID-19 declaration under Section 564(b)(1) of the Act, 21 U.S.C. section 360bbb-3(b)(1), unless the authorization is terminated or revoked sooner. Performed at [facility], [address] [place], [place] [address]   Culture, blood (Routine X 2) w Reflex to ID Panel     Status: None (Preliminary result)   Collection Time: 10/05/19  1:31 PM   Specimen: BLOOD  RIGHT ARM  Result Value Ref Range Status   Specimen Description   Final    BLOOD RIGHT ARM Performed at [facility], [address], [place], [place] [address]    Special Requests   Final    BOTTLES DRAWN AEROBIC AND ANAEROBIC Blood Culture adequate volume Performed at [facility], [address], [place], [place] [address]    Culture   Final    NO GROWTH 4 DAYS Performed at [facility], [address], [place], [place] [address]    Report Status PENDING  Incomplete  Culture, blood (Routine X 2) w Reflex to ID Panel     Status: None (Preliminary result)   Collection Time: 10/05/19  2:28 PM   Specimen: BLOOD  Result Value Ref Range Status   Specimen Description   Final    BLOOD RIGHT ANTECUBITAL Performed at [facility], [address] [place], [place] [address]    Special Requests   Final    BOTTLES DRAWN AEROBIC AND ANAEROBIC Blood Culture adequate volume Performed at [facility], [address], [place], [place] [address]    Culture    Final    NO GROWTH 4 DAYS Performed at [facility], [address], [place], [place] [address]    Report Status PENDING  Incomplete  Culture, Urine     Status: Abnormal   Collection Time: 10/06/19  5:54 PM   Specimen: Urine, Random  Result Value Ref Range Status   Specimen Description   Final    URINE, RANDOM Performed at [facility], [address], [place], [place] [address]    Special Requests   Final    NONE Performed at [facility], [address], [place], [place] [address]    Culture (A)  Final    <10,000 COLONIES/mL INSIGNIFICANT GROWTH Performed at [facility], [address], [place], [place] [address]    Report Status 10/08/2019 FINAL  Final  MRSA PCR Screening     Status: None   Collection Time: 10/06/19  6:18 PM   Specimen: Urine, Random; Nasopharyngeal  Result Value Ref Range Status   MRSA by PCR NEGATIVE NEGATIVE Final    Comment:        The GeneXpert MRSA Assay (FDA approved for NASAL specimens only), is one component of a comprehensive MRSA colonization surveillance program. It is not intended to diagnose MRSA infection nor to guide or monitor treatment for MRSA infections. Performed at [facility], [address], [place], [place] [address]       Radiology Studies: No results found.      Scheduled Meds: . amLODipine  5 mg Oral Daily  . docusate sodium  100 mg Oral BID  . enoxaparin (LOVENOX) injection  40 mg Subcutaneous Q24H   Continuous Infusions: . sodium chloride 10 mL/hr at 10/07/19 0600  . ceFEPime (MAXIPIME) IV 2 g (10/09/19 1407)  . metronidazole 500 mg (10/09/19 1546)   Assessment/Plan:  Acute cholecystitis with sepsis and acute metabolic encephalopathy: POA. Patient had WBC 22.5, lactic acidosis lactic acid 4on presentation. Blood culture obtained in the ED no growth so far. Patient underwent MRCP which showed , multiple gallstones with GB wall  enhancement and progressive RUQ pericholecystic fluid unable to rule out acute cholecystitis.No evidence for choledocholithiasis/CBD or main pancreatic duct dilatation. Patient subsequently underwent HIDA scan with nonvisualization of the GB after 2 hours of imaging compatible  with cystic duct obstruction due to cholecystitis, also showed underlying cholestasis (LFTs elevated).She was started on vanc, Cefepime and Flagylon admission, now off vancomycin with clinical improvement. Gen Surgery following-conservative mx with slow advancement in diet (FLD->dysphagia 1  Today as okayed per ST),  IV abx -can likely transition to oral in am as leucocytosis resolved today.   Abnormal UA suspect for UTI, urine culture wasobtained after abx treatment  Mild thrombocytopenia, likely due to sepsis, monitor  Hypertension-She initially presented with borderline low blood pressure , now blood pressure started to increase, she has tendency to have bradycardia, IV hydralazine is on national shortage, started [employer].Change to oral bp meds once able to taker oral meds reliably  Hypokalemia: replaced   Dementia FTT: at baseline oriented to place/ personand ambulates without assistance. Patient from home, currently very weak, will need PT/OT once medically stable. POA is interested in SNF placement. Seen by palliative care team.    DVT prophylaxis: Lovenox Code Status: DNR Family / Patient Communication: d/w POA Mr Kreg Shropshire understands overall poor prognosis with advanced age and poor oral intake. He understands and does not want aggressive interventions like feeding tubes/TPN. Plan to d/c in am with palliative care f/u at SNF.  Disposition Plan:   Status is: Inpatient  Remains inpatient appropriate because:IV treatments appropriate due to intensity of illness or inability to take PO   Dispo: The patient is from: home  Anticipated d/c is to: SNF  Anticipated  d/c date is: 5/19, covid screen ordered  Patient currently is not medically stable to d/c.      LOS: 4 days    Time spent: 25 minutes    Guilford Shi, MD [facility] Pager in [place]  If 7PM-7AM, please contact night-coverage [email or web address] 10/09/2019, 5:59 PM

## 2019-10-09 NOTE — Plan of Care (Signed)
  Problem: Education: Goal: Knowledge of General Education information will improve Description Including pain rating scale, medication(s)/side effects and non-pharmacologic comfort measures Outcome: Progressing   

## 2019-10-09 NOTE — Progress Notes (Signed)
Pharmacy Antibiotic Note  Danielle Myers is a 86 y.o. female admitted on 10/05/2019 with sepsis, possible abdominal source.  Pharmacy has been consulted for Cefepime dosing; MD dosing flagyl.  10/09/2019: Day#5 abx.  Afebrile, WBC WNL Renal fxn stable Minimal oral intake, will not swallow oral meds per RN Cx data remains negative  Plan:  Cefepime 2g IV q12h  Continue Flagyl per MD  Noted surgery recommendations to complete 10 days antibiotics.  Change to Augmentin once pt able to tolerate oral meds.   Height: 5' (152.4 cm) Weight: 64.1 kg (141 lb 5 oz) IBW/kg (Calculated) : 45.5  Temp (24hrs), Avg:97.8 F (36.6 C), Min:97.8 F (36.6 C), Max:97.8 F (36.6 C)  Recent Labs  Lab 10/05/19 1123 10/05/19 1123 10/05/19 1359 10/05/19 1715 10/05/19 1937 10/05/19 1941 10/06/19 0440 10/06/19 1914 10/07/19 0657 10/08/19 0444 10/09/19 0528  WBC 22.5*  --   --   --   --  22.6* 18.3* 15.8*  --  8.1  --   CREATININE 0.80   < >  --   --   --  0.59 0.52  --  0.62 0.65 0.57  LATICACIDVEN  --   --    < > 2.7* 2.5*  --  1.4  --  1.3 1.1  --    < > = values in this interval not displayed.    Estimated Creatinine Clearance: 32 mL/min (by C-G formula based on SCr of 0.57 mg/dL).    No Known Allergies  Antimicrobials this admission:  5/14 Vanc >>5/16 5/14 Cefepime >> 5/14 Flagyl >>  Dose adjustments this admission:   Microbiology results:  5/14 COVID: neg 5/14 WYS:HUOH 5/15 MRSA PCR: neg 5/15 UCx: <10K insignificant growth  Thank you for allowing pharmacy to be a part of this patient's care.  Junita Push, PharmD, BCPS Pharmacy: [phone] 10/09/2019 10:21 AM

## 2019-10-10 DIAGNOSIS — G9341 Metabolic encephalopathy: Secondary | ICD-10-CM | POA: Diagnosis not present

## 2019-10-10 DIAGNOSIS — R1312 Dysphagia, oropharyngeal phase: Secondary | ICD-10-CM | POA: Diagnosis not present

## 2019-10-10 DIAGNOSIS — R0989 Other specified symptoms and signs involving the circulatory and respiratory systems: Secondary | ICD-10-CM | POA: Diagnosis not present

## 2019-10-10 DIAGNOSIS — M6281 Muscle weakness (generalized): Secondary | ICD-10-CM | POA: Diagnosis not present

## 2019-10-10 DIAGNOSIS — S22080A Wedge compression fracture of T11-T12 vertebra, initial encounter for closed fracture: Secondary | ICD-10-CM | POA: Diagnosis not present

## 2019-10-10 DIAGNOSIS — K8020 Calculus of gallbladder without cholecystitis without obstruction: Secondary | ICD-10-CM | POA: Diagnosis not present

## 2019-10-10 DIAGNOSIS — R630 Anorexia: Secondary | ICD-10-CM | POA: Diagnosis not present

## 2019-10-10 DIAGNOSIS — Z7401 Bed confinement status: Secondary | ICD-10-CM | POA: Diagnosis not present

## 2019-10-10 DIAGNOSIS — F0280 Dementia in other diseases classified elsewhere without behavioral disturbance: Secondary | ICD-10-CM | POA: Diagnosis not present

## 2019-10-10 DIAGNOSIS — A419 Sepsis, unspecified organism: Secondary | ICD-10-CM | POA: Diagnosis not present

## 2019-10-10 DIAGNOSIS — M2550 Pain in unspecified joint: Secondary | ICD-10-CM | POA: Diagnosis not present

## 2019-10-10 DIAGNOSIS — R531 Weakness: Secondary | ICD-10-CM | POA: Diagnosis not present

## 2019-10-10 DIAGNOSIS — G301 Alzheimer's disease with late onset: Secondary | ICD-10-CM | POA: Diagnosis not present

## 2019-10-10 DIAGNOSIS — R6520 Severe sepsis without septic shock: Secondary | ICD-10-CM | POA: Diagnosis not present

## 2019-10-10 DIAGNOSIS — K810 Acute cholecystitis: Secondary | ICD-10-CM | POA: Diagnosis not present

## 2019-10-10 DIAGNOSIS — R41841 Cognitive communication deficit: Secondary | ICD-10-CM | POA: Diagnosis not present

## 2019-10-10 DIAGNOSIS — R627 Adult failure to thrive: Secondary | ICD-10-CM | POA: Diagnosis not present

## 2019-10-10 DIAGNOSIS — R1319 Other dysphagia: Secondary | ICD-10-CM | POA: Diagnosis not present

## 2019-10-10 DIAGNOSIS — A4189 Other specified sepsis: Secondary | ICD-10-CM | POA: Diagnosis not present

## 2019-10-10 LAB — CULTURE, BLOOD (ROUTINE X 2)
Culture: NO GROWTH
Culture: NO GROWTH
Special Requests: ADEQUATE
Special Requests: ADEQUATE

## 2019-10-10 LAB — SARS CORONAVIRUS 2 BY RT PCR (HOSPITAL ORDER, PERFORMED IN ~~LOC~~ HOSPITAL LAB): SARS Coronavirus 2: NEGATIVE

## 2019-10-10 MED ORDER — AMLODIPINE BESYLATE 5 MG PO TABS: 5.0000 mg | ORAL_TABLET | Freq: Every day | ORAL | 0 refills | Status: DC

## 2019-10-10 MED ORDER — DOCUSATE SODIUM 100 MG PO CAPS: 100.0000 mg | ORAL_CAPSULE | Freq: Two times a day (BID) | ORAL | 0 refills | Status: AC

## 2019-10-10 MED ORDER — AMLODIPINE BESYLATE 10 MG PO TABS
10.0000 mg | ORAL_TABLET | Freq: Every day | ORAL | Status: DC
  Filled 2019-10-10: qty 1

## 2019-10-10 MED ORDER — CEFDINIR 300 MG PO CAPS
300.0000 mg | ORAL_CAPSULE | Freq: Two times a day (BID) | ORAL | 0 refills | Status: AC
Start: 2019-10-10 — End: 2019-10-17

## 2019-10-10 MED ORDER — ONDANSETRON HCL 4 MG PO TABS: 4.0000 mg | ORAL_TABLET | Freq: Four times a day (QID) | ORAL | 0 refills | Status: AC | PRN

## 2019-10-10 MED ORDER — AMLODIPINE BESYLATE 10 MG PO TABS: 10.0000 mg | ORAL_TABLET | Freq: Every day | ORAL | 0 refills | Status: AC

## 2019-10-10 MED ORDER — METRONIDAZOLE 500 MG PO TABS
500.0000 mg | ORAL_TABLET | Freq: Three times a day (TID) | ORAL | 0 refills | Status: AC
Start: 2019-10-10 — End: 2019-10-17

## 2019-10-10 MED ORDER — VITAMIN D 25 MCG (1000 UNIT) PO TABS
1000.0000 [IU] | ORAL_TABLET | Freq: Every day | ORAL | Status: AC
Start: 2019-10-10 — End: ?

## 2019-10-10 NOTE — Progress Notes (Signed)
Physical Therapy Treatment Patient Details Name: Danielle Myers MRN: [id number] DOB: 1918-10-15 Today's Date: 10/10/2019    History of Present Illness 86 YO female Pt admitted 10/05/19 with AMS, weakness and sepsis 2* acute cholecysitis.  Pt wtih hx of asthma, dementia     PT Comments    The patient presents with a significant difference in functional mobility  Than on eval 3 days ago where patient ambulated x 10' with RW with 2 assisting. . Today patient required total assistance to sit up due to posterior bias and legs extended. Required 2 total assistance to pick the patient up and transfer to recliner. Continue PT  For mobility.  Follow Up Recommendations  SNF     Equipment Recommendations  None recommended by PT    Recommendations for Other Services       Precautions / Restrictions Precautions Precautions: Fall Precaution Comments: incontinent    Mobility  Bed Mobility Overal bed mobility: Needs Assistance Bed Mobility: Supine to Sit     Supine to sit: Total assist     General bed mobility comments: patient does not follow , quite resistive and pushing backwards, extends the legs, bed pad used to scoop the patient to forward flex trunk, manualy flexed knees to place feet on floor, pt. extends them again.  Transfers Overall transfer level: Needs assistance Equipment used: 2 person hand held assist Transfers: Sit to/from [facility] Sit to Stand: +2 safety/equipment;+2 physical assistance;Total assist Stand pivot transfers: Total assist;+2 safety/equipment;+2 physical assistance       General transfer comment: patient pushing back into extension, bed pad supported under hips, 2 armhold lift to recliner, patient in complete extension once in recliner. Multiple attempts to reposition.  Ambulation/Gait             General Gait Details: unable, very rigid with trunk and legs extending   Stairs             Wheelchair Mobility     Modified Rankin (Stroke Patients Only)       Balance Overall balance assessment: Needs assistance Sitting-balance support: Feet unsupported;Bilateral upper extremity supported Sitting balance-Leahy Scale: Zero   Postural control: Posterior lean Standing balance support: Bilateral upper extremity supported;During functional activity Standing balance-Leahy Scale: Zero                              Cognition Arousal/Alertness: Awake/alert Behavior During Therapy: Anxious Overall Cognitive Status: History of cognitive impairments - at baseline                                 General Comments: repeats" oh no' no other words      Exercises      General Comments        Pertinent Vitals/Pain Pain Assessment: Faces Faces Pain Scale: No hurt    Home Living                      Prior Function            PT Goals (current goals can now be found in the care plan section) Progress towards PT goals: Not progressing toward goals - comment(pt with significant difference in miobility)    Frequency    Min 2X/week      PT Plan Current plan remains appropriate;Frequency needs to be updated    Co-evaluation  AM-PAC PT "6 Clicks" Mobility   Outcome Measure  Help needed turning from your back to your side while in a flat bed without using bedrails?: Total Help needed moving from lying on your back to sitting on the side of a flat bed without using bedrails?: Total Help needed moving to and from a bed to a chair (including a wheelchair)?: Total Help needed standing up from a chair using your arms (e.g., wheelchair or bedside chair)?: Total Help needed to walk in hospital room?: Total Help needed climbing 3-5 steps with a railing? : Total 6 Click Score: 6    End of Session Equipment Utilized During Treatment: Gait belt Activity Tolerance: Patient limited by fatigue Patient left: in chair;with call bell/phone within  reach;with chair alarm set Nurse Communication: Mobility status PT Visit Diagnosis: Unsteadiness on feet (R26.81);Muscle weakness (generalized) (M62.81);Difficulty in walking, not elsewhere classified (R26.2)     Time: 6431-4276 PT Time Calculation (min) (ACUTE ONLY): 27 min  Charges:  $Therapeutic Activity: 23-37 mins                     Blanchard Kelch PT Acute Rehabilitation Services Pager [phone] Office [phone]    Rada Hay 10/10/2019, 9:25 AM

## 2019-10-10 NOTE — Discharge Summary (Signed)
Physician Discharge Summary  Danielle Myers VQQ:[id number] DOB: 1918/11/26 DOA: 10/05/2019  PCP: Danielle Pee, MD (Inactive)  Admit date: 10/05/2019 Discharge date: 10/10/2019 Consultations: General surgery Dr Danielle Myers, Palliative care Dr Danielle Myers Admitted From: home Disposition: SNF  Discharge Diagnoses:  Principal Problem:   Acute cholecystitis Active Problems:   Sepsis (HCC)   Acute metabolic encephalopathy   Essential hypertension   Dementia without behavioral disturbance (HCC)   Hypokalemia   Hospital Course Summary: 86 y.o.femalewithPMH ofHTN, HLD, Mild asthma, dementia, who lives alone with the assistance ofHHaides,brought in by [facility] ([phone])in concern for hypersomnolence,weakness, uri sxs 3 days, as well asvomiting/diarrhea since themorning of presentation. Per POA at baseline she is oriented to person and place, however became more confused and lethargicover 24 hrs. He alsoreportedthat patient is very stoic,dislikes doctor visits and has not followed up with her PCP ortaken prescribed medications forsome time. However generally does well at home thoughneeds HHassistance with most ADLS. ED Course:Afebrile,BP111/58, hr 89,RR20, sat 92% onRA.Labs: 22.5, hbg14.1, Na144, KL2.8,cr0.8. AST207,ALT154,ALP196,T.bili4.9, Lipase 22.Lactic:4, UA:+. BNP260 CXR:NAD.CT abd:? Acute cholecystitis/ confirmed by ultrasound Left ovarian cyst ,CT Head:NAD Hospital course: Patient [facility] for[facility] sepsis,suspect source from acute cholecystitis, general surgery consulted  Acute cholecystitis with sepsis and acute metabolic encephalopathy: POA. Patient hadWBC 22.5, lactic acidosis lactic acid 4on presentation.Blood culture obtained in the ED no growth so far. Patient underwent MRCP which showed, multiple gallstoneswith GBwall enhancement and progressive RUQpericholecystic fluid unable torule out acute cholecystitis.No evidence for  choledocholithiasis/CBD or main pancreatic duct dilatation.Patient subsequently underwentHIDA scanwith nonvisualization of theGBafter 2 hours of imaging compatible with cystic duct obstruction /acute cholecystitis, also showedunderlying cholestasis (LFTs elevated).Shewasstarted on vanc, Cefepime and Flagylon admission,now offvancomycin with clinical improvement. Gen Surgery evaluated patient and recommended conservative mx with slow advancement in diet (CLD->FLD->dysphagia 1 as okayed per ST),  IV abx -can  transition to oral antibiotics today to complete a 10 day course as suggested by Gen surgery.  ALP still elevated in 200s but Total bilirubin trending down to 1.5. She has been afebrile.Leucocytosis resolved now. She is tolerating dysphagia 1 diet fairly well although he appetite is poor and needs feeding assistance. If patient were to deteriorate or develop fever, Percutaneous cholecystotomy tube (PCT) placement can be considered as outpatient per GS. d/w POA Mr Danielle Myers understands overall poor prognosis with 86 age and poor oral intake. He understands and does not want aggressive interventions like feeding tubes/TPN. Recommend follow up palliative care at [facility] and Gen surgery clinic if needed in 1 week.  Danielle Myers -initially suspected UTI, urine cultureobtained after abx treatment shows insignificant growth.  Mild thrombocytopenia, likely due to sepsis, improved with treatment (120-->148)  Hypertension-She initially presented with borderline low blood pressure , then blood pressure started to increase. Held home med -HCTZ given poor oral intake. Started on Norvasc 5 mg, BP still up-increased to 10 mg. Watch for hypotension at SNF.   Hypokalemia: replaced   DementiaFTT: atbaseline oriented to place/personand ambulateswithout assistance. Patientfrom home,currently very weak and deconditioned.POA expressed interest in SNF placement. Seen bypalliative care  team and SW. Transfer to SNF today.   Discharge Exam:  Vitals:   10/09/19 2103 10/10/19 0459  BP: (!) 159/90 (!) 161/81  Pulse: 88 66  Resp: 16 18  Temp: 98.5 F (36.9 C) 97.6 F (36.4 C)  SpO2: 97% 100%   Vitals:   10/09/19 1356 10/09/19 2103 10/10/19 0456 10/10/19 0459  BP: (!) 154/81 (!) 159/90  (!) 161/81  Pulse:  73 88  66  Resp: Temp: 98.2 F (36.8 C) 98.5 F (36.9 C)  97.6 F (36.4 C)  TempSrc: Oral Oral  Oral  SpO2: 99% 97%  100%  Weight:   63.5 kg   Height:        General: Pt is alert, awake, disoriented, not in acute distress Cardiovascular: RRR, S1/S2 +, no rubs, no gallops Respiratory: CTA bilaterally, no wheezing, no rhonchi Abdominal: Soft, NT, ND, bowel sounds + Extremities: no edema, no cyanosis  Discharge Condition:Stable CODE STATUS: DNR Diet recommendation: Dysphagia 1 pureed diet , thin liquids, meds to be crushed Recommendations for Outpatient Follow-up:  1. Follow up with PCP and Palliative care at SNF  2. Follow up with consultants: General surgery clinic in 1 week (sooner if symptomatic) 3. Please obtain follow up labs including: CMP in 3 days  Home Health services upon discharge:  Equipment/Devices upon discharge:   Discharge Instructions:  Discharge Instructions    Call MD for:  difficulty breathing, headache or visual disturbances   Complete by: As directed    Call MD for:  extreme fatigue   Complete by: As directed    Call MD for:  persistant nausea and vomiting   Complete by: As directed    Call MD for:  severe uncontrolled pain   Complete by: As directed    Call MD for:  temperature >100.4   Complete by: As directed    Diet - low sodium heart healthy   Complete by: As directed    Dysphagia 1 or pureed consistency   Discharge instructions   Complete by: As directed    Follow up palliative care team at SNF   Increase activity slowly   Complete by: As directed      Allergies as of 10/10/2019   No Known  Allergies     Medication List    STOP taking these medications   hydrochlorothiazide 12.5 MG capsule Commonly known as: MICROZIDE   mometasone 50 MCG/ACT nasal spray Commonly known as: NASONEX   OVER THE COUNTER MEDICATION     TAKE these medications   amLODipine 10 MG tablet Commonly known as: NORVASC Take 1 tablet (10 mg total) by mouth daily.   cefdinir 300 MG capsule Commonly known as: OMNICEF Take 1 capsule (300 mg total) by mouth 2 (two) times daily for 7 days.   cholecalciferol 25 MCG (1000 UNIT) tablet Commonly known as: VITAMIN D3 Take 1 tablet (1,000 Units total) by mouth daily. What changed:   medication strength  how much to take   docusate sodium 100 MG capsule Commonly known as: COLACE Take 1 capsule (100 mg total) by mouth 2 (two) times daily.   metroNIDAZOLE 500 MG tablet Commonly known as: Flagyl Take 1 tablet (500 mg total) by mouth 3 (three) times daily for 7 days.   ondansetron 4 MG tablet Commonly known as: ZOFRAN Take 1 tablet (4 mg total) by mouth every 6 (six) hours as needed for nausea.   VITAMIN C PO Take 1 tablet by mouth daily.      Contact information for after-discharge care    Destination    [facility] .   Service: Skilled Nursing Contact information: [address] [place] [address] [phone]             No Known Allergies    The results of significant diagnostics from this hospitalization (including imaging, microbiology, ancillary and laboratory) are listed below for  reference.    Labs: BNP (last 3 results) Recent Labs    10/05/19 1123  BNP 260.6*   Basic Metabolic Panel: Recent Labs  Lab 10/05/19 1123 10/05/19 1123 10/05/19 1941 10/06/19 0440 10/07/19 0657 10/08/19 0444 10/09/19 0528  NA 144  --   --  140 143 139 138  K 2.8*  --   --  4.5 3.6 3.2* 3.5  CL 110  --   --  118* 116* 113* 107  CO2 21*  --   --  19* 18* 18* 21*  GLUCOSE 139*  --   --  88 84 81  95  BUN 12  --   --  CREATININE 0.80   < > 0.59 0.52 0.62 0.65 0.57  CALCIUM 9.1  --   --  7.6* 9.0 8.4* 8.5*  MG  --   --   --   --  1.8 1.8  --    < > = values in this interval not displayed.   Liver Function Tests: Recent Labs  Lab 10/05/19 1123 10/06/19 0440 10/07/19 0657 10/08/19 0444 10/09/19 0528  AST 207* 96* 48* 25 25  ALT 154* 99* 81* 53* 43  ALKPHOS 196* 134* 187* 173* 222*  BILITOT 4.9* 3.2* 2.8* 1.7* 1.5*  PROT 6.6 4.9* 5.8* 4.9* 5.5*  ALBUMIN 3.2* 2.5* 3.1* 2.6* 2.9*   Recent Labs  Lab 10/05/19 1123  LIPASE 22   No results for input(s): AMMONIA in the last 168 hours. CBC: Recent Labs  Lab 10/05/19 1123 10/05/19 1941 10/06/19 0440 10/06/19 1914 10/08/19 0444  WBC 22.5* 22.6* 18.3* 15.8* 8.1  NEUTROABS 20.7*  --   --  13.6* 6.1  HGB 14.1 12.4 11.4* 12.9 11.4*  HCT 43.1 39.3 36.1 40.8 35.3*  MCV 90.0 94.0 93.3 92.3 90.1  PLT 140* 124* 120* 148* 148*   Cardiac Enzymes: No results for input(s): CKTOTAL, CKMB, CKMBINDEX, TROPONINI in the last 168 hours. BNP: Invalid input(s): POCBNP CBG: Recent Labs  Lab 10/06/19 0850  GLUCAP 73   D-Dimer No results for input(s): DDIMER in the last 72 hours. Hgb A1c No results for input(s): HGBA1C in the last 72 hours. Lipid Profile No results for input(s): CHOL, HDL, LDLCALC, TRIG, CHOLHDL, LDLDIRECT in the last 72 hours. Thyroid function studies No results for input(s): TSH, T4TOTAL, T3FREE, THYROIDAB in the last 72 hours.  Invalid input(s): FREET3 Anemia work up No results for input(s): VITAMINB12, FOLATE, FERRITIN, TIBC, IRON, RETICCTPCT in the last 72 hours. Urinalysis    Component Value Date/Time   COLORURINE AMBER (A) 10/05/2019 1123   APPEARANCEUR CLOUDY (A) 10/05/2019 1123   LABSPEC >1.046 (H) 10/05/2019 1123   PHURINE 6.0 10/05/2019 1123   GLUCOSEU NEGATIVE 10/05/2019 1123   HGBUR NEGATIVE 10/05/2019 1123   HGBUR trace-intact 04/15/2009 0841   BILIRUBINUR SMALL (A) 10/05/2019  1123   KETONESUR 5 (A) 10/05/2019 1123   PROTEINUR 100 (A) 10/05/2019 1123   UROBILINOGEN 0.2 02/09/2015 1410   NITRITE NEGATIVE 10/05/2019 1123   LEUKOCYTESUR LARGE (A) 10/05/2019 1123   Sepsis Labs Invalid input(s): PROCALCITONIN,  WBC,  LACTICIDVEN Microbiology Recent Results (from the past 240 hour(s))  SARS Coronavirus 2 by RT PCR (hospital order, performed in [facility] hospital lab) Nasopharyngeal Nasopharyngeal Swab     Status: None   Collection Time: 10/05/19 11:23 AM   Specimen: Nasopharyngeal Swab  Result Value Ref Range Status   SARS Coronavirus 2 NEGATIVE NEGATIVE Final    Comment: (NOTE) SARS-CoV-2  target nucleic acids are NOT DETECTED. The SARS-CoV-2 RNA is generally detectable in upper and lower respiratory specimens during the acute phase of infection. The lowest concentration of SARS-CoV-2 viral copies this assay can detect is 250 copies / mL. A negative result does not preclude SARS-CoV-2 infection and should not be used as the sole basis for treatment or other patient management decisions.  A negative result may occur with improper specimen collection / handling, submission of specimen other than nasopharyngeal swab, presence of viral mutation(s) within the areas targeted by this assay, and inadequate number of viral copies (<250 copies / mL). A negative result must be combined with clinical observations, patient history, and epidemiological information. Fact Sheet for Patients:   StrictlyIdeas.no Fact Sheet for Healthcare Providers: BankingDealers.co.za This test is not yet approved or cleared  by the [place] FDA and has been authorized for detection and/or diagnosis of SARS-CoV-2 by FDA under an Emergency Use Authorization (EUA).  This EUA will remain in effect (meaning this test can be used) for the duration of the COVID-19 declaration under Section 564(b)(1) of the Act, 21 U.S.C. section 360bbb-3(b)(1),  unless the authorization is terminated or revoked sooner. Performed at [facility], [place] [address] [place], [place] [address]   Culture, blood (Routine X 2) w Reflex to ID Panel     Status: None   Collection Time: 10/05/19  1:31 PM   Specimen: BLOOD RIGHT ARM  Result Value Ref Range Status   Specimen Description   Final    BLOOD RIGHT ARM Performed at [facility] [address] [place], [place] [address]    Special Requests   Final    BOTTLES DRAWN AEROBIC AND ANAEROBIC Blood Culture adequate volume Performed at [facility] [address] [place], [place] [address]    Culture   Final    NO GROWTH 5 DAYS Performed at [facility], [place] [address] [place], [place] [address]    Report Status 10/10/2019 FINAL  Final  Culture, blood (Routine X 2) w Reflex to ID Panel     Status: None   Collection Time: 10/05/19  2:28 PM   Specimen: BLOOD  Result Value Ref Range Status   Specimen Description   Final    BLOOD RIGHT ANTECUBITAL Performed at [facility], [place] [address], [place], [place] [address]    Special Requests   Final    BOTTLES DRAWN AEROBIC AND ANAEROBIC Blood Culture adequate volume Performed at [facility] [address] [place], [place] [address]    Culture   Final    NO GROWTH 5 DAYS Performed at [facility], [place] [address], [place], [place] [address]    Report Status 10/10/2019 FINAL  Final  Culture, Urine     Status: Abnormal   Collection Time: 10/06/19  5:54 PM   Specimen: Urine, Random  Result Value Ref Range Status   Specimen Description   Final    URINE, RANDOM Performed at [facility] [address] [place], [place] [address]    Special Requests   Final    NONE Performed at [facility], [place] [address] [place], [place] [address]    Culture (A)  Final    <10,000 COLONIES/mL INSIGNIFICANT GROWTH Performed at  [facility] [address], [place], [place] [address]    Report Status 10/08/2019 FINAL  Final  MRSA PCR Screening  Status: None   Collection Time: 10/06/19  6:18 PM   Specimen: Urine, Random; Nasopharyngeal  Result Value Ref Range Status   MRSA by PCR NEGATIVE NEGATIVE Final    Comment:        The GeneXpert MRSA Assay (FDA approved for NASAL specimens only), is one component of a comprehensive MRSA colonization surveillance program. It is not intended to diagnose MRSA infection nor to guide or monitor treatment for MRSA infections. Performed at [facility], [address], [place], [place] [address]   SARS Coronavirus 2 by RT PCR (hospital order, performed in [facility] hospital lab) Nasopharyngeal Nasopharyngeal Swab     Status: None   Collection Time: 10/10/19  5:15 AM   Specimen: Nasopharyngeal Swab  Result Value Ref Range Status   SARS Coronavirus 2 NEGATIVE NEGATIVE Final    Comment: (NOTE) SARS-CoV-2 target nucleic acids are NOT DETECTED. The SARS-CoV-2 RNA is generally detectable in upper and lower respiratory specimens during the acute phase of infection. The lowest concentration of SARS-CoV-2 viral copies this assay can detect is 250 copies / mL. A negative result does not preclude SARS-CoV-2 infection and should not be used as the sole basis for treatment or other patient management decisions.  A negative result may occur with improper specimen collection / handling, submission of specimen other than nasopharyngeal swab, presence of viral mutation(s) within the areas targeted by this assay, and inadequate number of viral copies (<250 copies / mL). A negative result must be combined with clinical observations, patient history, and epidemiological information. Fact Sheet for Patients:   BoilerBrush.com.cy Fact Sheet for Healthcare Providers: https://pope.com/ This test is not yet  approved or cleared  by the [place] FDA and has been authorized for detection and/or diagnosis of SARS-CoV-2 by FDA under an Emergency Use Authorization (EUA).  This EUA will remain in effect (meaning this test can be used) for the duration of the COVID-19 declaration under Section 564(b)(1) of the Act, 21 U.S.C. section 360bbb-3(b)(1), unless the authorization is terminated or revoked sooner. Performed at [facility], [address], [place], [place] [address]     Procedures/Studies: CT Head Wo Contrast  Result Date: 10/05/2019 CLINICAL DATA:  Altered mental status EXAM: CT HEAD WITHOUT CONTRAST TECHNIQUE: Contiguous axial images were obtained from the base of the skull through the vertex without intravenous contrast. COMPARISON:  10/05/2017 FINDINGS: Brain: There is no acute intracranial hemorrhage, mass effect, or edema. Gray-white differentiation is preserved. There is no extra-axial fluid collection. Prominence of the ventricles and sulci reflects generalized parenchymal volume loss. Patchy and confluent areas of hypoattenuation in the supratentorial white matter are nonspecific but probably reflect moderate chronic microvascular ischemic changes. Vascular: There is atherosclerotic calcification at the skull base. Skull: Calvarium is unremarkable. Sinuses/Orbits: Mild mucosal thickening. Orbits are unremarkable. Other: Mastoid air cells are clear. IMPRESSION: No acute intracranial hemorrhage, mass effect, or evidence of acute infarction. Moderate chronic microvascular ischemic changes. Electronically Signed   By: Guadlupe Spanish M.D.   On: 10/05/2019 15:01   NM Hepatobiliary Liver Func  Addendum Date: 10/06/2019   ADDENDUM REPORT: 10/06/2019 17:56 ADDENDUM: This case was subsequently reviewed in conjunction with additional consulting radiologist. MRI/MRCP from earlier today was also re-evaluated. The prominent and persistent liver activity noted extending through the  end of the second hour of imaging is concerning for intrinsic hepatocyte dysfunction possibly secondary to hepatitis. Therefore, the lack of gallbladder filling is less specific for acute cholecystitis and may be due to  diminished biliary activity due to hepatocellular dysfunction. Electronically Signed   By: Signa Kell M.D.   On: 10/06/2019 17:56   Result Date: 10/06/2019 CLINICAL DATA:  Evaluate for cholecystitis. Altered mental status, vomiting, sepsis and gallstones EXAM: NUCLEAR MEDICINE HEPATOBILIARY IMAGING TECHNIQUE: Sequential images of the abdomen were obtained out to 60 minutes following intravenous administration of radiopharmaceutical. RADIOPHARMACEUTICALS:  7.5 mCi Tc-47m  Choletec IV COMPARISON:  MRI abdomen 08/06/2019 and abdominal sonogram 10/05/2019. FINDINGS: Prompt radiotracer uptake by the liver is seen. During the first hour of imaging there is faint activity noted within the common bile duct. No gallbladder or bowel activity noted during the first hour. During the second hour there is progressive radiotracer accumulation within the common bile duct and bowel loops. No gallbladder activity identified however. IMPRESSION: 1. Nonvisualization of the gallbladder after 2 hours of imaging compatible with cystic duct obstruction due to cholecystitis. 2. Delayed biliary accumulation which may reflect underlying cholestasis. Electronically Signed: By: Signa Kell M.D. On: 10/06/2019 13:17   US Pelvis Complete  Result Date: 10/05/2019 CLINICAL DATA:  Mass on CT imaging EXAM: TRANSABDOMINAL ULTRASOUND OF PELVIS TECHNIQUE: Transabdominal ultrasound examination of the pelvis was performed including evaluation of the uterus, ovaries, adnexal regions, and pelvic cul-de-sac. COMPARISON:  CT abdomen pelvis 09/05/2019 FINDINGS: Uterus Measurements: 5.6 x 2.9 x 4.0 cm = volume: 33 mL. Atrophic appearance, appropriate for age. No fibroid or mass visualized. Endometrium Thickness: 4 mm, non thickened.   Atrophic without focal abnormality. Right ovary Measurements: 2.8 x 1.5 x 1.7 cm = volume: 3.9 mL. Small 1.6 x 0.9 x 1.3 cm anechoic cystic structure possibly exophytic from or arising adjacent to the right ovary. Left ovary Measurements: 7.6 x 7.2 x 8 cm = volume: 228 mL. Possibly erroneously measurement given unclear margins of the normal left ovarian parenchyma alongside the larger anechoic cystic structures in the left adnexa. The largest measures 6.5 x 5.3 x 6.7 cm corresponding well to larger lesion seen on CT. A smaller more tubular cystic area is seen posterior measuring 4.4 x 2.6 x 5.5 cm, possibly reflecting a dilated tube/hydrosalpinx. Other findings: Small volume of free fluid is noted in the posterior cul-de-sac IMPRESSION: Corresponding to the abnormality on CT imaging is a large 6.5 x 5.3 x 6.7 cm cyst without significant internal complexity. More posteriorly an elongated 4.4 x 2.6 x 5.5 cm anechoic structure possibly reflects a dilated tube/hydrosalpinx. Additional 1.6 x 0.9 x 1.3 cm ovarian versus paraovarian cyst in the right adnexa. While these do not demonstrate significant internal complexity or typically concerning features, size criteria warrants at minimum six-month surveillance ultrasound to assess for interval growth. Furthermore consider gynecologic consultation if not previously obtained. This recommendation follows consensus guidelines: Simple Adnexal Cysts: SRU Consensus Conference Update on Follow-up and Reporting. Radiology 2019; [id number]. Electronically Signed   By: Kreg Shropshire M.D.   On: 10/05/2019 17:06   CT Abdomen Pelvis W Contrast  Result Date: 10/05/2019 CLINICAL DATA:  86 year old with nausea and vomiting. Suspected diverticulitis. Abnormal LFTs. EXAM: CT ABDOMEN AND PELVIS WITH CONTRAST TECHNIQUE: Multidetector CT imaging of the abdomen and pelvis was performed using the standard protocol following bolus administration of intravenous contrast. CONTRAST:  103mL  OMNIPAQUE IOHEXOL 300 MG/ML  SOLN COMPARISON:  None. FINDINGS: Lower chest: Minimal pleural fluid or thickening in the posterior right chest. Minimal left posterior pleural thickening. Eccentric soft tissue adjacent to the distal right pulmonary artery on sequence 2 image 1. This appears to be causing narrowing or compression of  the distal right pulmonary artery. Atherosclerotic calcifications in the visualized thoracic aorta. Patchy densities at the lung bases could represent atelectasis or areas of scarring. Hepatobiliary: Gallbladder is mildly distended. There may be a small amount of fluid adjacent to the gallbladder. No significant intrahepatic or extrahepatic biliary dilatation. Main portal venous system is patent. Pancreas: Unremarkable. No pancreatic ductal dilatation or surrounding inflammatory changes. Spleen: Normal in size without focal abnormality. Adrenals/Urinary Tract: No gross abnormality to the adrenal glands. Normal appearance of both kidneys. No appearance of the urinary bladder. Small low-density structure in the posterior left kidney is too small to definitively characterize but could represent a small cyst. No hydronephrosis. Stomach/Bowel: Normal appearance of the stomach and duodenum. Rectum is distended with a large amount of stool. Wall thickening involving the distal sigmoid colon adjacent to the rectal stool. Diverticula involving the sigmoid colon. No clear evidence for acute bowel inflammation. No evidence for a bowel obstruction. Vascular/Lymphatic: Atherosclerotic calcifications in the abdominal aorta without aneurysm. Main visceral arteries are patent. Venous structures are unremarkable. No evidence for abdominopelvic lymphadenopathy. Reproductive: Uterus appears to be present. There is a large low-density structure in the left hemipelvis that measures 6.5 x 5.8 x 6.1 cm. There is an additional low-density collection posterior to the larger lesion that measures 3.8 x 2.5 x 4.7 cm.  Findings are concerning for left ovarian/adnexal cystic lesions. Other: There may be a small amount of fluid around the gallbladder. Otherwise, no significant ascites. Negative for free air. Musculoskeletal: Mild anterolisthesis at L4-L5 secondary to facet arthropathy. Vertebral body compression deformity and fracture at T12 and likely new since the chest radiograph on 10/05/2017. IMPRESSION: 1. Low density lesions in the left hemipelvis, largest measuring up to 6.5 cm. Findings are concerning for left ovarian/adnexal cystic lesions. Recommend further characterization with pelvic ultrasound. 2. Gallbladder is mildly distended. Cannot exclude a small amount of pericholecystic fluid. Elevated bilirubin and abnormal LFTs. Findings raise concern for underlying gallbladder inflammation and recommend further characterization with right upper quadrant ultrasound. 3. Soft tissue attenuation in the region of the distal right pulmonary artery. This area is incompletely evaluated. Findings could be associated with chronic pulmonary artery thrombus or adjacent soft tissue. Findings are equivocal on this abdominal CT. This may be better characterized with CTA of the chest using with pulmonary embolism protocol. 4. Diverticulosis involving the sigmoid colon but limited evaluation of sigmoid colon due to the large low-density pelvic collections. Rectum is distended with large amount of stool. 5. T12 compression fracture of uncertain age. These results were called by telephone at the time of interpretation on 10/05/2019 at 3:26 pm to provider Vanetta Mulders , who verbally acknowledged these results. Electronically Signed   By: Richarda Overlie M.D.   On: 10/05/2019 15:27   MR 3D Recon At Scanner  Addendum Date: 10/06/2019   ADDENDUM REPORT: 10/06/2019 17:58 ADDENDUM: This case has been subsequently reviewed with additional consulting radiologist. In conjunction with findings from nuclear medicine hepatic biliary scan from today.  Findings are less specific for acute cholecystitis and could be due to liver inflammation due to hepatitis. Electronically Signed   By: Signa Kell M.D.   On: 10/06/2019 17:58   Result Date: 10/06/2019 CLINICAL DATA:  Abdominal pain. Suspect cholecystitis. EXAM: MRI ABDOMEN WITHOUT AND WITH CONTRAST (INCLUDING MRCP) TECHNIQUE: Multiplanar multisequence MR imaging of the abdomen was performed both before and after the administration of intravenous contrast. Heavily T2-weighted images of the biliary and pancreatic ducts were obtained, and three-dimensional MRCP images were  rendered by post processing. CONTRAST:  6mL GADAVIST GADOBUTROL 1 MMOL/ML IV SOLN COMPARISON:  CT AP 10/05/2019 FINDINGS: Lower chest: Small to moderate bilateral pleural effusions. Hepatobiliary: 5 mm cyst identified within the anterior right lobe of liver. Postcontrast imaging is limited due to excessive motion artifact. Multiple stones are identified within the gallbladder which measure up to 9 mm. Gallbladder wall enhancement and mild edema noted. Right upper quadrant pericholecystic fluid is identified. The common bile duct is nondilated measuring 4 mm. No convincing evidence for choledocholithiasis. Pancreas: Evaluation of the pancreas is significantly diminished due to motion artifact. No findings to suggest main duct dilatation, inflammation or mass. Spleen:  Within normal limits in size and appearance. Adrenals/Urinary Tract: No masses identified. No evidence of hydronephrosis. Stomach/Bowel: Stomach appears nondistended. No dilated loops of bowel identified within the upper abdomen. Vascular/Lymphatic: No pathologically enlarged lymph nodes identified. No abdominal aortic aneurysm demonstrated. Other: Right upper quadrant free fluid is identified and appears increased from the previous exam. Musculoskeletal: T12 compression deformity is again noted. IMPRESSION: 1. Examination is significantly diminished due to motion artifact. 2.  There are multiple gallstones identified within the gallbladder. There is gallbladder wall enhancement and progressive right upper quadrant and pericholecystic fluid. Cannot rule out acute cholecystitis. 3. No convincing evidence for choledocholithiasis. No evidence for CBD or main pancreatic duct dilatation. 4. Small to moderate bilateral pleural effusions. Electronically Signed: By: Signa Kellaylor  Stroud M.D. On: 10/06/2019 10:57   US Abdomen Limited  Result Date: 10/05/2019 CLINICAL DATA:  Possible cholecystitis on CT. EXAM: ULTRASOUND ABDOMEN LIMITED RIGHT UPPER QUADRANT COMPARISON:  CT abdomen and pelvis 10/05/2019 FINDINGS: Gallbladder: Multiple gallstones measuring up to 1.5 cm. Prominent gallbladder wall thickening up to 1.3 cm with pericholecystic fluid. No sonographic Murphy sign noted by sonographer. Common bile duct: Diameter: 5 mm Liver: Diffusely increased parenchymal echogenicity without focal lesion identified. Portal vein is patent on color Doppler imaging with normal direction of blood flow towards the liver. Other: None. IMPRESSION: 1. Distended gallbladder with gallstones, prominent gallbladder wall thickening, and pericholecystic fluid. This is concerning for acute cholecystitis in the appropriate clinical setting, although note that the sonographic Eulah PontMurphy sign was negative. 2. Echogenic liver which may reflect steatosis. Electronically Signed   By: Sebastian AcheAllen  Grady M.D.   On: 10/05/2019 17:02   DG Chest Port 1 View  Result Date: 10/05/2019 CLINICAL DATA:  Fever EXAM: PORTABLE CHEST 1 VIEW COMPARISON:  10/05/2017 FINDINGS: Chronically elevated right hemidiaphragm with right lower lobe atelectasis unchanged. Left lung is clear. Negative for heart failure infiltrate or effusion. No interval change. IMPRESSION: No active disease. Electronically Signed   By: Marlan Palauharles  Clark M.D.   On: 10/05/2019 12:09   MR ABDOMEN MRCP W WO CONTAST  Addendum Date: 10/06/2019   ADDENDUM REPORT: 10/06/2019 17:58  ADDENDUM: This case has been subsequently reviewed with additional consulting radiologist. In conjunction with findings from nuclear medicine hepatic biliary scan from today. Findings are less specific for acute cholecystitis and could be due to liver inflammation due to hepatitis. Electronically Signed   By: Signa Kellaylor  Stroud M.D.   On: 10/06/2019 17:58   Result Date: 10/06/2019 CLINICAL DATA:  Abdominal pain. Suspect cholecystitis. EXAM: MRI ABDOMEN WITHOUT AND WITH CONTRAST (INCLUDING MRCP) TECHNIQUE: Multiplanar multisequence MR imaging of the abdomen was performed both before and after the administration of intravenous contrast. Heavily T2-weighted images of the biliary and pancreatic ducts were obtained, and three-dimensional MRCP images were rendered by post processing. CONTRAST:  6mL GADAVIST GADOBUTROL 1 MMOL/ML IV SOLN  COMPARISON:  CT AP 10/05/2019 FINDINGS: Lower chest: Small to moderate bilateral pleural effusions. Hepatobiliary: 5 mm cyst identified within the anterior right lobe of liver. Postcontrast imaging is limited due to excessive motion artifact. Multiple stones are identified within the gallbladder which measure up to 9 mm. Gallbladder wall enhancement and mild edema noted. Right upper quadrant pericholecystic fluid is identified. The common bile duct is nondilated measuring 4 mm. No convincing evidence for choledocholithiasis. Pancreas: Evaluation of the pancreas is significantly diminished due to motion artifact. No findings to suggest main duct dilatation, inflammation or mass. Spleen:  Within normal limits in size and appearance. Adrenals/Urinary Tract: No masses identified. No evidence of hydronephrosis. Stomach/Bowel: Stomach appears nondistended. No dilated loops of bowel identified within the upper abdomen. Vascular/Lymphatic: No pathologically enlarged lymph nodes identified. No abdominal aortic aneurysm demonstrated. Other: Right upper quadrant free fluid is identified and appears  increased from the previous exam. Musculoskeletal: T12 compression deformity is again noted. IMPRESSION: 1. Examination is significantly diminished due to motion artifact. 2. There are multiple gallstones identified within the gallbladder. There is gallbladder wall enhancement and progressive right upper quadrant and pericholecystic fluid. Cannot rule out acute cholecystitis. 3. No convincing evidence for choledocholithiasis. No evidence for CBD or main pancreatic duct dilatation. 4. Small to moderate bilateral pleural effusions. Electronically Signed: By: Signa Kell M.D. On: 10/06/2019 10:57    Time coordinating discharge: Over 30 minutes  SIGNED:   Alessandra Bevels, MD  [facility] 10/10/2019, 8:35 AM

## 2019-10-10 NOTE — Plan of Care (Signed)
  Problem: Education: Goal: Knowledge of General Education information will improve Description: Including pain rating scale, medication(s)/side effects and non-pharmacologic comfort measures Outcome: Adequate for Discharge   

## 2019-10-10 NOTE — Progress Notes (Signed)
Report called at this time to [facility] and given to [place] LPN

## 2019-10-10 NOTE — TOC Transition Note (Signed)
Transition of Care [facility]) - CM/SW Discharge Note   Patient Details  Name: Danielle Myers MRN: [id number] Date of Birth: [birth year]  Transition of Care [facility]) CM/SW Contact:  Ida Rogue, LCSW Phone Number: 10/10/2019, 11:20 AM   Clinical Narrative:   Patient to transfer to [facility] today.  PTAR arranged.  Nursing, please call report to [phone], room 1201P. TOC sign off.    Final next level of care: Skilled Nursing Facility Barriers to Discharge: Barriers Resolved   Patient Goals and CMS Choice     Choice offered to / list presented to : [facility] POA / Guardian  Discharge Placement                       Discharge Plan and Services   Discharge Planning Services: CM Consult Post Acute Care Choice: Skilled Nursing Facility                               Social Determinants of Health (SDOH) Interventions     Readmission Risk Interventions No flowsheet data found.

## 2019-10-10 NOTE — Progress Notes (Signed)
Two attempts have  Been made to call report to [facility].  The phone rings continuously without being answered.  Will try again shortly

## 2019-10-11 DIAGNOSIS — K8020 Calculus of gallbladder without cholecystitis without obstruction: Secondary | ICD-10-CM | POA: Diagnosis not present

## 2019-10-11 DIAGNOSIS — A4189 Other specified sepsis: Secondary | ICD-10-CM | POA: Diagnosis not present

## 2019-10-11 DIAGNOSIS — R6520 Severe sepsis without septic shock: Secondary | ICD-10-CM | POA: Diagnosis not present

## 2019-10-11 DIAGNOSIS — R1319 Other dysphagia: Secondary | ICD-10-CM | POA: Diagnosis not present

## 2019-10-24 DIAGNOSIS — F0280 Dementia in other diseases classified elsewhere without behavioral disturbance: Secondary | ICD-10-CM | POA: Diagnosis not present

## 2019-10-24 DIAGNOSIS — R0989 Other specified symptoms and signs involving the circulatory and respiratory systems: Secondary | ICD-10-CM | POA: Diagnosis not present

## 2019-10-24 DIAGNOSIS — R630 Anorexia: Secondary | ICD-10-CM | POA: Diagnosis not present

## 2019-10-24 DIAGNOSIS — A4189 Other specified sepsis: Secondary | ICD-10-CM | POA: Diagnosis not present

## 2019-10-26 DIAGNOSIS — R1319 Other dysphagia: Secondary | ICD-10-CM | POA: Diagnosis not present

## 2019-10-26 DIAGNOSIS — K8020 Calculus of gallbladder without cholecystitis without obstruction: Secondary | ICD-10-CM | POA: Diagnosis not present

## 2019-10-26 DIAGNOSIS — K810 Acute cholecystitis: Secondary | ICD-10-CM | POA: Diagnosis not present

## 2019-10-26 DIAGNOSIS — R627 Adult failure to thrive: Secondary | ICD-10-CM | POA: Diagnosis not present

## 2019-11-26 DIAGNOSIS — K810 Acute cholecystitis: Secondary | ICD-10-CM | POA: Diagnosis not present

## 2019-11-26 DIAGNOSIS — R1312 Dysphagia, oropharyngeal phase: Secondary | ICD-10-CM | POA: Diagnosis not present

## 2019-11-26 DIAGNOSIS — S22080A Wedge compression fracture of T11-T12 vertebra, initial encounter for closed fracture: Secondary | ICD-10-CM | POA: Diagnosis not present

## 2019-11-26 DIAGNOSIS — M6281 Muscle weakness (generalized): Secondary | ICD-10-CM | POA: Diagnosis not present

## 2019-12-27 DIAGNOSIS — K810 Acute cholecystitis: Secondary | ICD-10-CM | POA: Diagnosis not present

## 2019-12-27 DIAGNOSIS — R1312 Dysphagia, oropharyngeal phase: Secondary | ICD-10-CM | POA: Diagnosis not present

## 2019-12-27 DIAGNOSIS — S22080A Wedge compression fracture of T11-T12 vertebra, initial encounter for closed fracture: Secondary | ICD-10-CM | POA: Diagnosis not present

## 2019-12-27 DIAGNOSIS — M6281 Muscle weakness (generalized): Secondary | ICD-10-CM | POA: Diagnosis not present

## 2020-01-23 DEATH — deceased

## 2021-09-27 IMAGING — MR MR ABDOMEN WO/W CM MRCP
18 of 19 series · 45 of 48 positions shown · IV contrast (gadavist)
Comparison: CT AP 10/05/2019
COMPARISON: CT AP 10/05/2019

Addendum:
CLINICAL DATA: Abdominal pain. Suspect cholecystitis.

EXAM:
MRI ABDOMEN WITHOUT AND WITH CONTRAST (INCLUDING MRCP)
TECHNIQUE: Multiplanar multisequence MR imaging of the abdomen was performed
both before and after the administration of intravenous contrast.
Heavily T2-weighted images of the biliary and pancreatic ducts were
obtained, and three-dimensional MRCP images were rendered by post
processing.
CONTRAST:  6mL GADAVIST GADOBUTROL 1 MMOL/ML IV SOLN

[Series 3: T2 fat-sat · axial · 6.0mm · 1.25mm/px · z∈[-122,+159]mm · 2 of 40 slices shown]
[im 1/40]
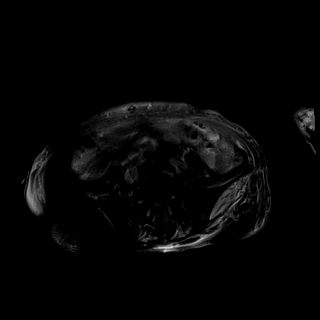
[im 40/40]
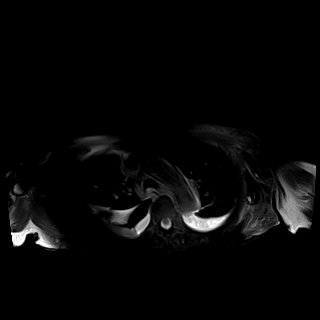

[Series 5: DWI · axial · 6.0mm · 1.49mm/px · z∈[-122,+159]mm · 4 of 78 slices shown (1 of 2)]
[im 1/78]
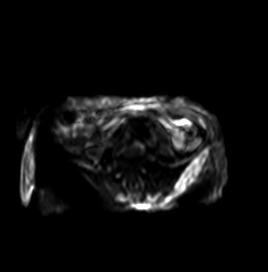
[im 26/78]
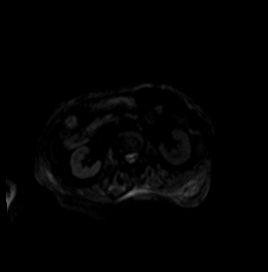
[im 52/78]
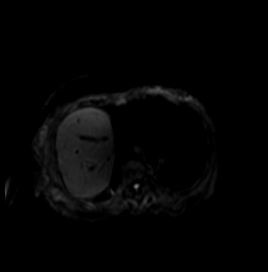
[im 78/78]
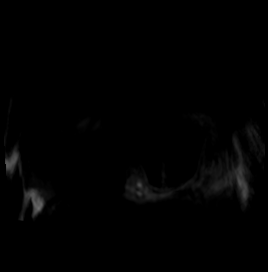

[Series 6: DWI · axial · 6.0mm · 1.49mm/px · 1 of 40 slices shown (2 of 2)]
[im 1/40]
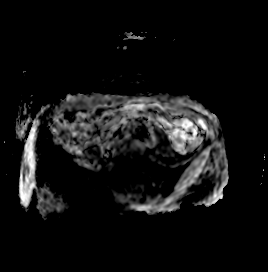

[Series 7: T1 · axial · 3.0mm · 1.25mm/px · z∈[-100,+137]mm · 3 of 80 slices shown (1 of 2)]
[im 1/80]
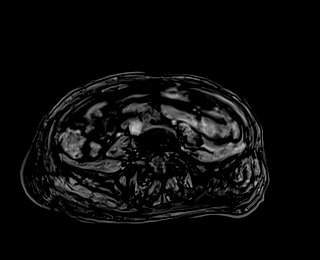
[im 40/80]
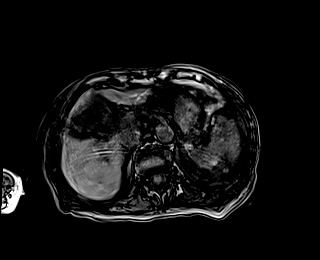
[im 80/80]
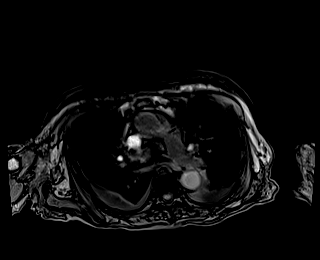

[Series 8: T1 · axial · 3.0mm · 1.25mm/px · z∈[-100,+137]mm · 3 of 80 slices shown (2 of 2)]
[im 1/80]
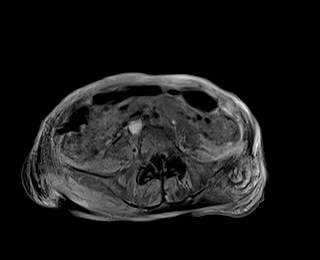
[im 40/80]
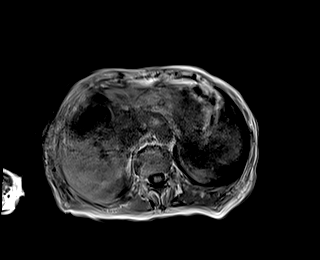
[im 80/80]
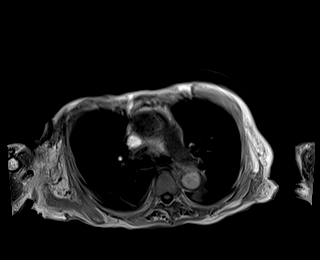

[Series 10: bSSFP · coronal · 6.0mm · 0.74mm/px · 1 of 35 slices shown]
[im 1/35]
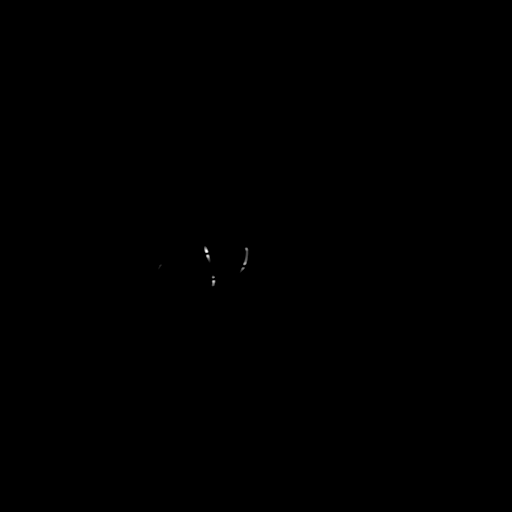

[Series 11: cor obl thk · sagittal · 50.0mm · 0.78mm/px · 1 of 9 slices shown]
[im 1/9]
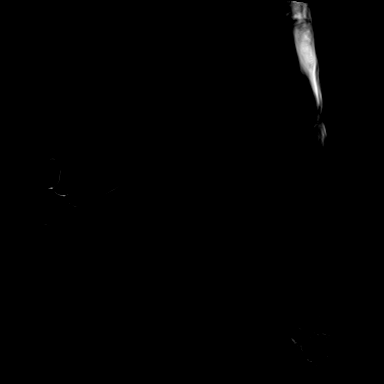

[Series 12: T2 · axial · 6.0mm · 1.56mm/px · 1 of 40 slices shown]
[im 1/40]
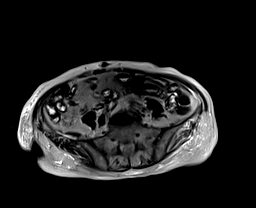

[Series 14: T1 dynamic · axial · 3.0mm · 1.25mm/px · z∈[-112,+149]mm · 3 of 88 slices shown (1 of 6)]
[im 1/88]
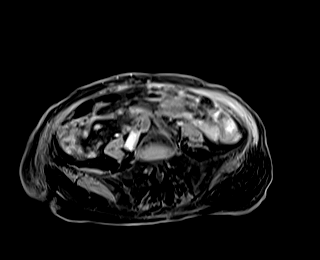
[im 44/88]
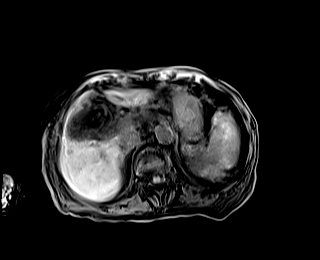
[im 88/88]
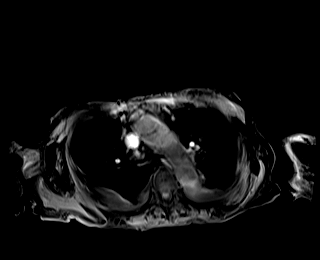

[Series 17: T1 dynamic · axial · 3.0mm · 1.25mm/px · z∈[-112,+149]mm · 3 of 88 slices shown (2 of 6)]
[im 1/88]
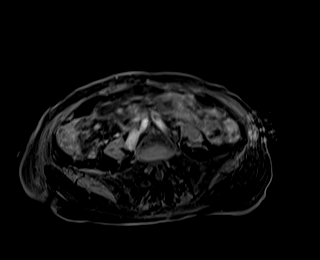
[im 44/88]
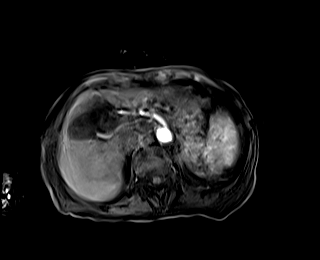
[im 88/88]
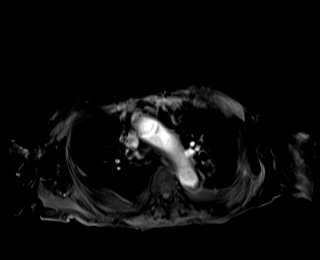

[Series 19: T1 dynamic · axial · 3.0mm · 1.25mm/px · z∈[-112,+149]mm · 3 of 88 slices shown (3 of 6)]
[im 1/88]
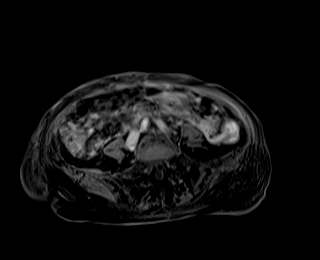
[im 44/88]
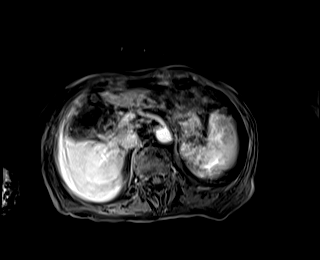
[im 88/88]
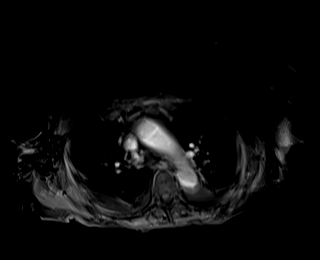

[Series 21: T1 dynamic · axial · 3.0mm · 1.25mm/px · z∈[-112,+149]mm · 3 of 88 slices shown (4 of 6)]
[im 1/88]
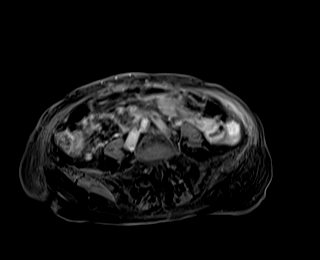
[im 44/88]
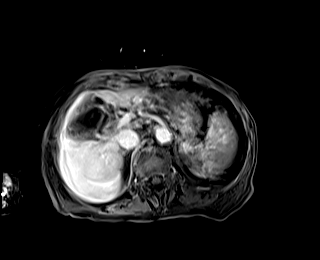
[im 88/88]
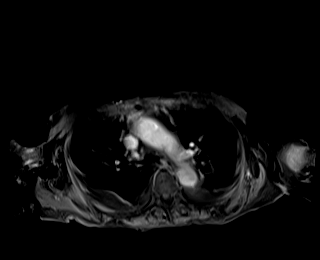

[Series 23: T1 dynamic · coronal · 3.0mm · 1.41mm/px · 3 of 72 slices shown (5 of 6)]
[im 1/72]
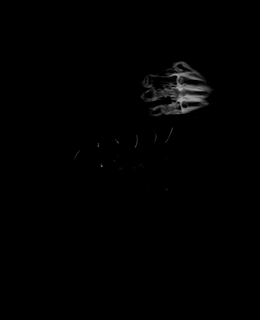
[im 36/72]
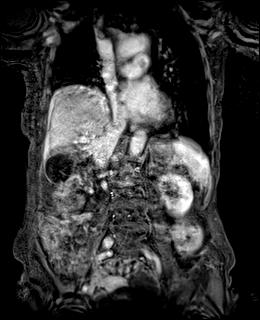
[im 72/72]
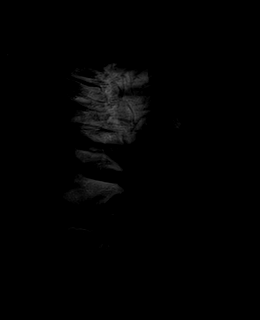

[Series 25: T1 dynamic · axial · 3.0mm · 1.25mm/px · z∈[-112,+149]mm · 3 of 88 slices shown (6 of 6)]
[im 1/88]
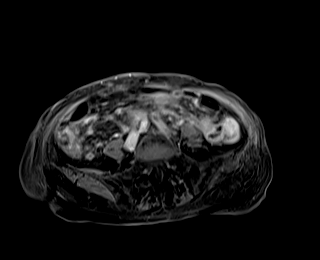
[im 44/88]
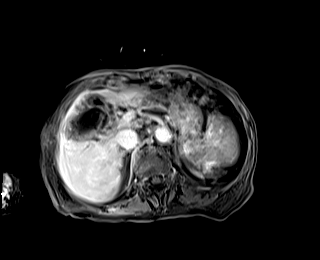
[im 88/88]
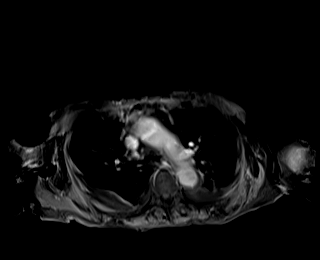

[Series 100: sub_(id) · axial · 3.0mm · 1.25mm/px · z∈[-112,+149]mm · 3 of 88 slices shown]
[im 1/88]
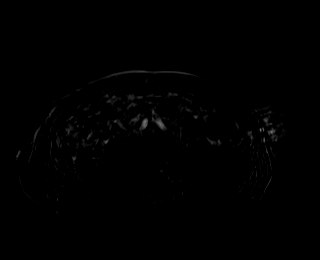
[im 44/88]
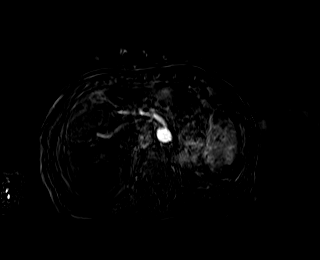
[im 88/88]
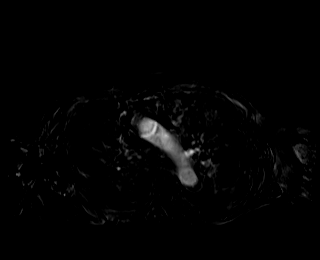

[Series 101: sub_45 · axial · 3.0mm · 1.25mm/px · z∈[-112,+149]mm · 3 of 88 slices shown]
[im 1/88]
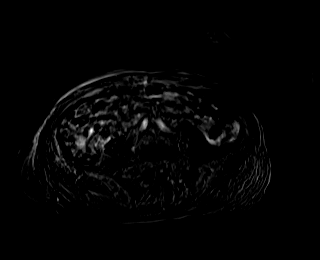
[im 44/88]
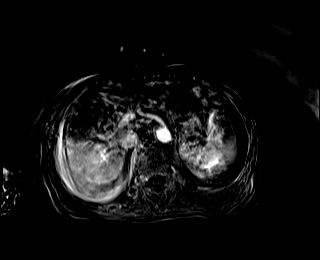
[im 88/88]
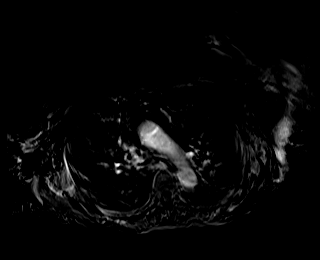

[Series 102: sub_90 · axial · 3.0mm · 1.25mm/px · z∈[-112,+149]mm · 3 of 88 slices shown]
[im 1/88]
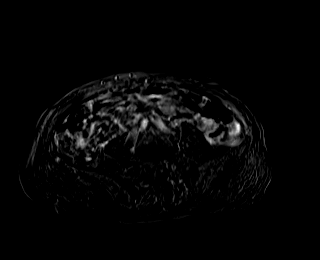
[im 44/88]
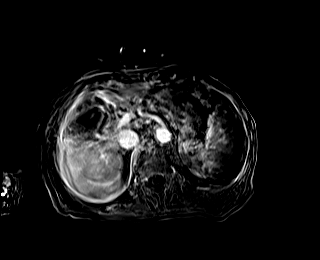
[im 88/88]
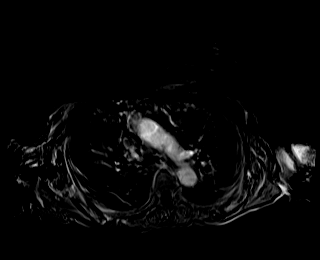

[Series 103: sub_delay · axial · 3.0mm · 1.25mm/px · z∈[-112,+17]mm · 2 of 88 slices shown]
[im 1/88]
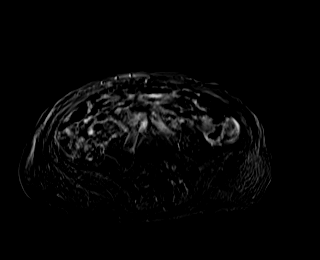
[im 44/88]
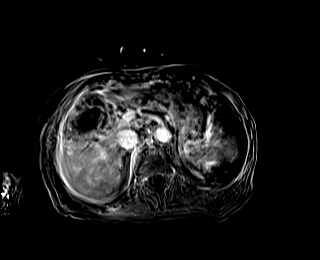

[45 of 48 positions shown; findings below may reference images not displayed]

FINDINGS: Lower chest: Small to moderate bilateral pleural effusions.

Hepatobiliary: 5 mm cyst identified within the anterior right lobe
of liver. Postcontrast imaging is limited due to excessive motion
artifact.

Multiple stones are identified within the gallbladder which measure
up to 9 mm. Gallbladder wall enhancement and mild edema noted. Right
upper quadrant pericholecystic fluid is identified. The common bile
duct is nondilated measuring 4 mm. No convincing evidence for
choledocholithiasis.

Pancreas: Evaluation of the pancreas is significantly diminished due
to motion artifact. No findings to suggest main duct dilatation,
inflammation or mass.

Spleen:  Within normal limits in size and appearance.

Adrenals/Urinary Tract: No masses identified. No evidence of
hydronephrosis.

Stomach/Bowel: Stomach appears nondistended. No dilated loops of
bowel identified within the upper abdomen.

Vascular/Lymphatic: No pathologically enlarged lymph nodes
identified. No abdominal aortic aneurysm demonstrated.

Other: Right upper quadrant free fluid is identified and appears
increased from the previous exam.

Musculoskeletal: T12 compression deformity is again noted.
IMPRESSION: 1. Examination is significantly diminished due to motion artifact.
2. There are multiple gallstones identified within the gallbladder.
There is gallbladder wall enhancement and progressive right upper
quadrant and pericholecystic fluid. Cannot rule out acute
cholecystitis.
3. No convincing evidence for choledocholithiasis. No evidence for
CBD or main pancreatic duct dilatation.
4. Small to moderate bilateral pleural effusions.

ADDENDUM:
This case has been subsequently reviewed with additional consulting
radiologist. In conjunction with findings from nuclear medicine
hepatic biliary scan from today. Findings are less specific for
acute cholecystitis and could be due to liver inflammation due to
hepatitis.

*** End of Addendum ***
FINDINGS: Lower chest: Small to moderate bilateral pleural effusions.

Hepatobiliary: 5 mm cyst identified within the anterior right lobe
of liver. Postcontrast imaging is limited due to excessive motion
artifact.

Multiple stones are identified within the gallbladder which measure
up to 9 mm. Gallbladder wall enhancement and mild edema noted. Right
upper quadrant pericholecystic fluid is identified. The common bile
duct is nondilated measuring 4 mm. No convincing evidence for
choledocholithiasis.

Pancreas: Evaluation of the pancreas is significantly diminished due
to motion artifact. No findings to suggest main duct dilatation,
inflammation or mass.

Spleen:  Within normal limits in size and appearance.

Adrenals/Urinary Tract: No masses identified. No evidence of
hydronephrosis.

Stomach/Bowel: Stomach appears nondistended. No dilated loops of
bowel identified within the upper abdomen.

Vascular/Lymphatic: No pathologically enlarged lymph nodes
identified. No abdominal aortic aneurysm demonstrated.

Other: Right upper quadrant free fluid is identified and appears
increased from the previous exam.

Musculoskeletal: T12 compression deformity is again noted.
IMPRESSION: 1. Examination is significantly diminished due to motion artifact.
2. There are multiple gallstones identified within the gallbladder.
There is gallbladder wall enhancement and progressive right upper
quadrant and pericholecystic fluid. Cannot rule out acute
cholecystitis.
3. No convincing evidence for choledocholithiasis. No evidence for
CBD or main pancreatic duct dilatation.
4. Small to moderate bilateral pleural effusions.

## 2021-09-27 IMAGING — MR MR 3D RECON AT SCANNER
18 of 19 series · 45 of 48 positions shown · IV contrast (gadavist)
Comparison: CT AP 10/05/2019
COMPARISON: CT AP 10/05/2019

Addendum:
CLINICAL DATA: Abdominal pain. Suspect cholecystitis.

EXAM:
MRI ABDOMEN WITHOUT AND WITH CONTRAST (INCLUDING MRCP)
TECHNIQUE: Multiplanar multisequence MR imaging of the abdomen was performed
both before and after the administration of intravenous contrast.
Heavily T2-weighted images of the biliary and pancreatic ducts were
obtained, and three-dimensional MRCP images were rendered by post
processing.
CONTRAST:  6mL GADAVIST GADOBUTROL 1 MMOL/ML IV SOLN

[Series 3: T2 fat-sat · axial · 6.0mm · 1.25mm/px · z∈[-122,+159]mm · 2 of 40 slices shown]
[im 1/40]
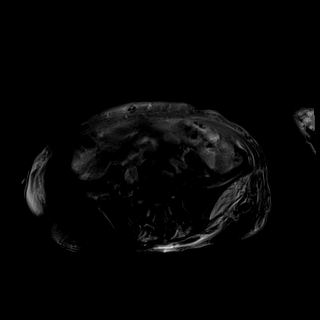
[im 40/40]
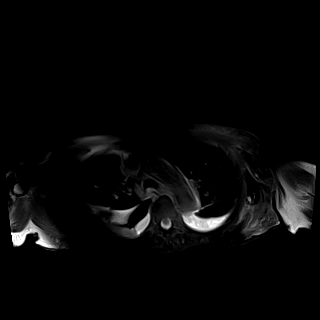

[Series 5: DWI · axial · 6.0mm · 1.49mm/px · z∈[-122,+159]mm · 4 of 78 slices shown (1 of 2)]
[im 1/78]
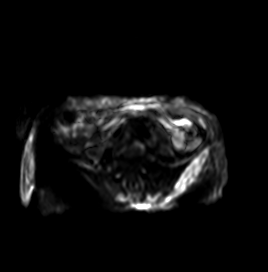
[im 26/78]
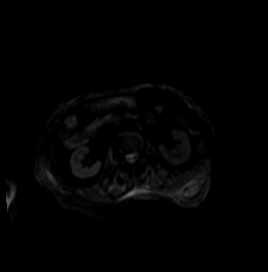
[im 52/78]
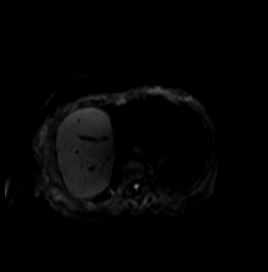
[im 78/78]
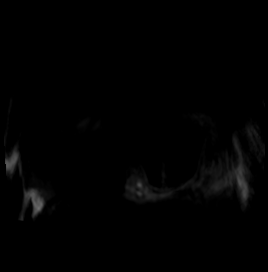

[Series 6: DWI · axial · 6.0mm · 1.49mm/px · 1 of 40 slices shown (2 of 2)]
[im 1/40]
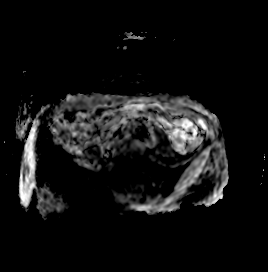

[Series 7: T1 · axial · 3.0mm · 1.25mm/px · z∈[-100,+137]mm · 3 of 80 slices shown (1 of 2)]
[im 1/80]
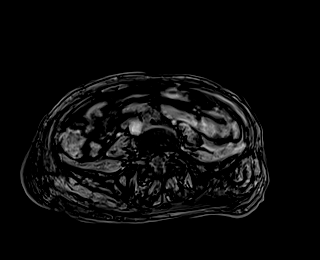
[im 40/80]
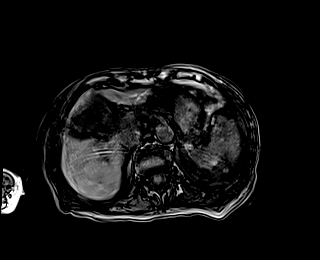
[im 80/80]
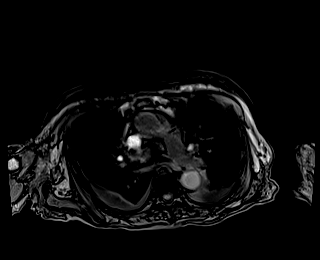

[Series 8: T1 · axial · 3.0mm · 1.25mm/px · z∈[-100,+137]mm · 3 of 80 slices shown (2 of 2)]
[im 1/80]
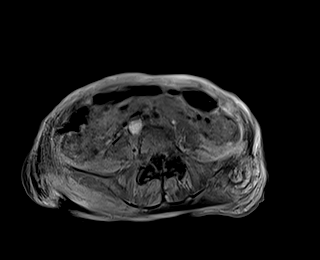
[im 40/80]
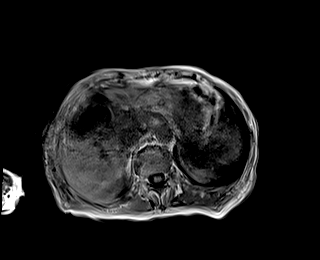
[im 80/80]
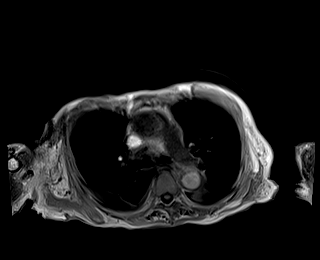

[Series 10: bSSFP · coronal · 6.0mm · 0.74mm/px · 1 of 35 slices shown]
[im 1/35]
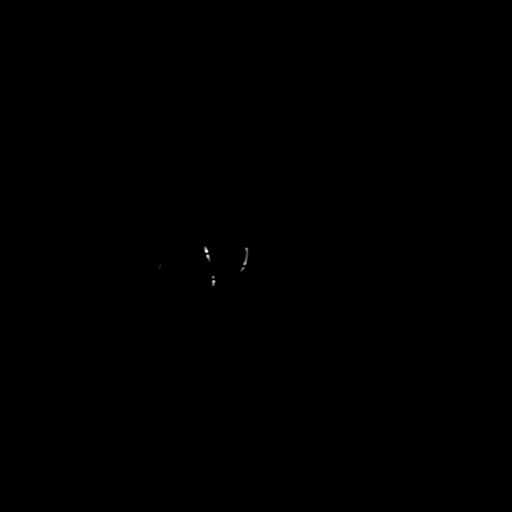

[Series 11: cor obl thk · sagittal · 50.0mm · 0.78mm/px · 1 of 9 slices shown]
[im 1/9]
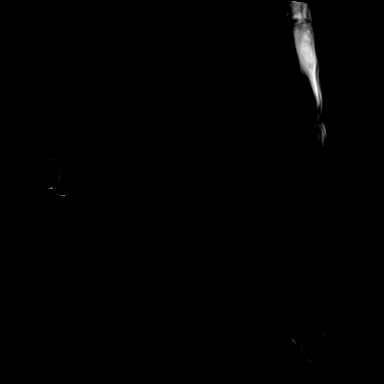

[Series 12: T2 · axial · 6.0mm · 1.56mm/px · 1 of 40 slices shown]
[im 1/40]
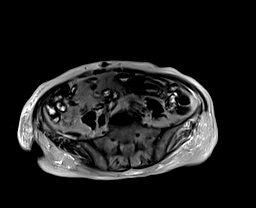

[Series 14: T1 dynamic · axial · 3.0mm · 1.25mm/px · z∈[-112,+149]mm · 3 of 88 slices shown (1 of 6)]
[im 1/88]
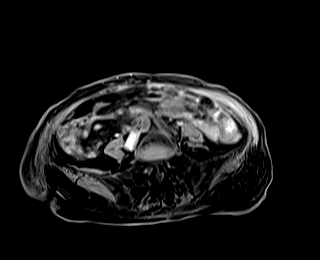
[im 44/88]
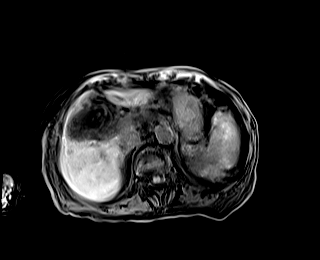
[im 88/88]
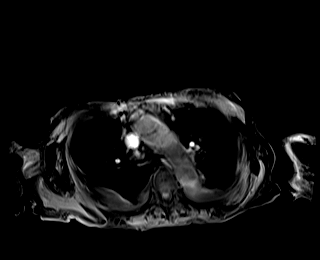

[Series 17: T1 dynamic · axial · 3.0mm · 1.25mm/px · z∈[-112,+149]mm · 3 of 88 slices shown (2 of 6)]
[im 1/88]
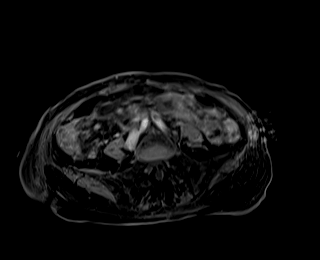
[im 44/88]
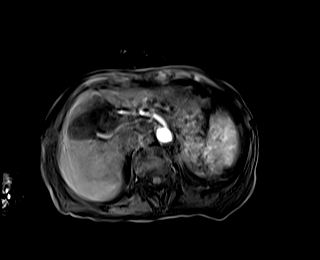
[im 88/88]
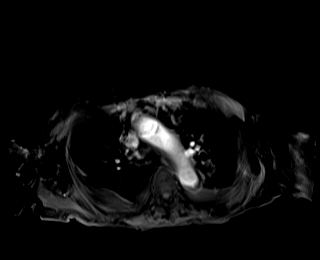

[Series 19: T1 dynamic · axial · 3.0mm · 1.25mm/px · z∈[-112,+149]mm · 3 of 88 slices shown (3 of 6)]
[im 1/88]
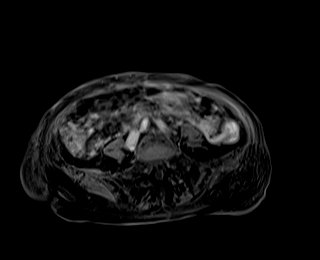
[im 44/88]
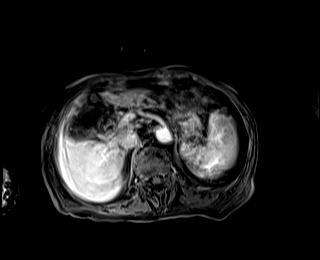
[im 88/88]
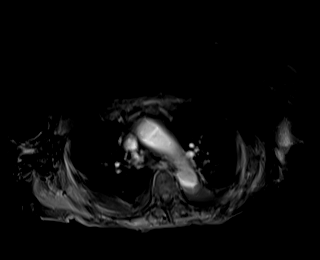

[Series 21: T1 dynamic · axial · 3.0mm · 1.25mm/px · z∈[-112,+149]mm · 3 of 88 slices shown (4 of 6)]
[im 1/88]
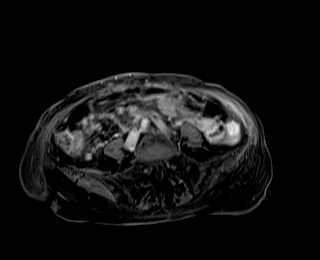
[im 44/88]
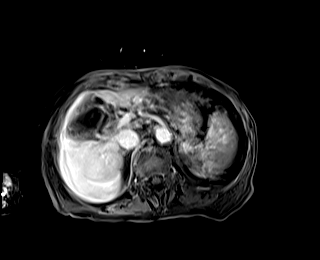
[im 88/88]
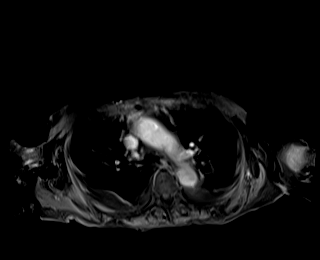

[Series 23: T1 dynamic · coronal · 3.0mm · 1.41mm/px · 3 of 72 slices shown (5 of 6)]
[im 1/72]
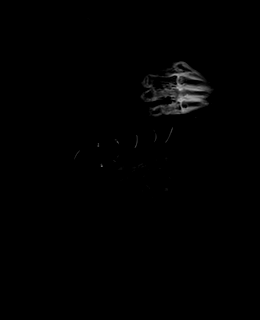
[im 36/72]
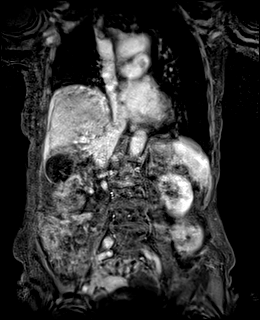
[im 72/72]
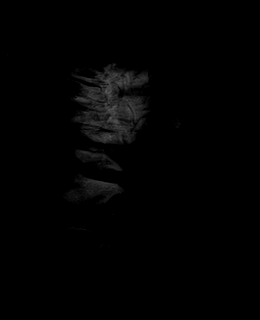

[Series 25: T1 dynamic · axial · 3.0mm · 1.25mm/px · z∈[-112,+149]mm · 3 of 88 slices shown (6 of 6)]
[im 1/88]
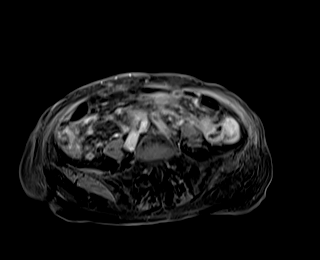
[im 44/88]
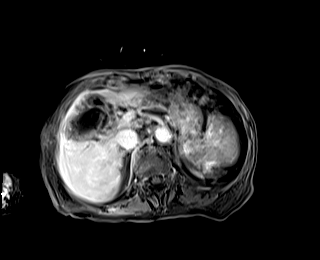
[im 88/88]
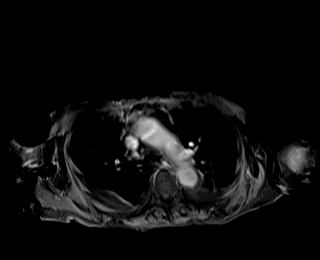

[Series 100: sub_(id) · axial · 3.0mm · 1.25mm/px · z∈[-112,+149]mm · 3 of 88 slices shown]
[im 1/88]
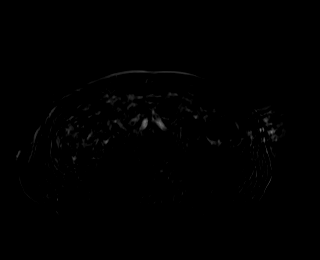
[im 44/88]
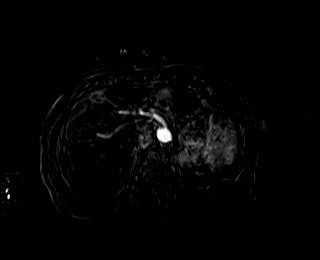
[im 88/88]
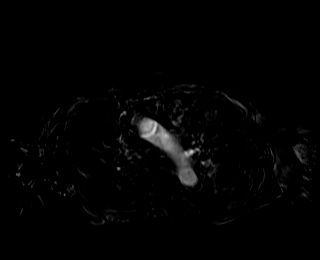

[Series 101: sub_45 · axial · 3.0mm · 1.25mm/px · z∈[-112,+149]mm · 3 of 88 slices shown]
[im 1/88]
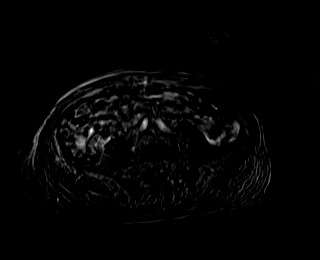
[im 44/88]
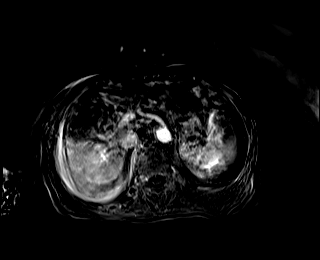
[im 88/88]
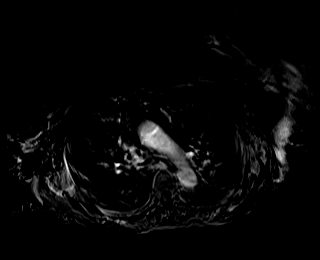

[Series 102: sub_90 · axial · 3.0mm · 1.25mm/px · z∈[-112,+149]mm · 3 of 88 slices shown]
[im 1/88]
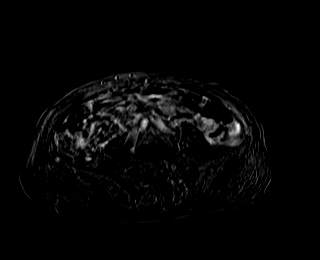
[im 44/88]
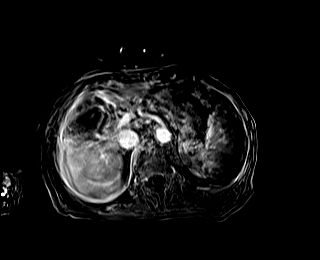
[im 88/88]
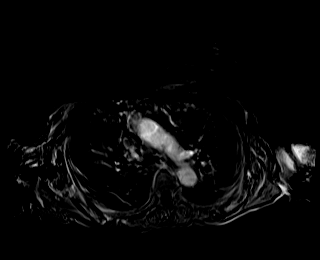

[Series 103: sub_delay · axial · 3.0mm · 1.25mm/px · z∈[-112,+17]mm · 2 of 88 slices shown]
[im 1/88]
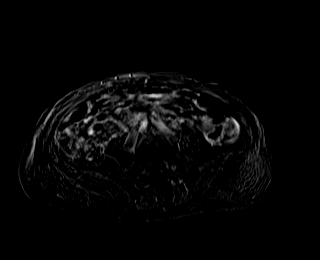
[im 44/88]
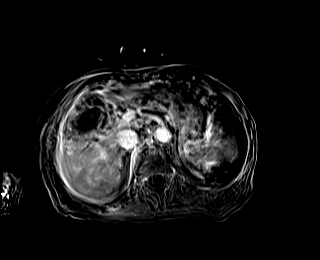

[45 of 48 positions shown; findings below may reference images not displayed]

FINDINGS: Lower chest: Small to moderate bilateral pleural effusions.

Hepatobiliary: 5 mm cyst identified within the anterior right lobe
of liver. Postcontrast imaging is limited due to excessive motion
artifact.

Multiple stones are identified within the gallbladder which measure
up to 9 mm. Gallbladder wall enhancement and mild edema noted. Right
upper quadrant pericholecystic fluid is identified. The common bile
duct is nondilated measuring 4 mm. No convincing evidence for
choledocholithiasis.

Pancreas: Evaluation of the pancreas is significantly diminished due
to motion artifact. No findings to suggest main duct dilatation,
inflammation or mass.

Spleen:  Within normal limits in size and appearance.

Adrenals/Urinary Tract: No masses identified. No evidence of
hydronephrosis.

Stomach/Bowel: Stomach appears nondistended. No dilated loops of
bowel identified within the upper abdomen.

Vascular/Lymphatic: No pathologically enlarged lymph nodes
identified. No abdominal aortic aneurysm demonstrated.

Other: Right upper quadrant free fluid is identified and appears
increased from the previous exam.

Musculoskeletal: T12 compression deformity is again noted.
IMPRESSION: 1. Examination is significantly diminished due to motion artifact.
2. There are multiple gallstones identified within the gallbladder.
There is gallbladder wall enhancement and progressive right upper
quadrant and pericholecystic fluid. Cannot rule out acute
cholecystitis.
3. No convincing evidence for choledocholithiasis. No evidence for
CBD or main pancreatic duct dilatation.
4. Small to moderate bilateral pleural effusions.

ADDENDUM:
This case has been subsequently reviewed with additional consulting
radiologist. In conjunction with findings from nuclear medicine
hepatic biliary scan from today. Findings are less specific for
acute cholecystitis and could be due to liver inflammation due to
hepatitis.

*** End of Addendum ***
FINDINGS: Lower chest: Small to moderate bilateral pleural effusions.

Hepatobiliary: 5 mm cyst identified within the anterior right lobe
of liver. Postcontrast imaging is limited due to excessive motion
artifact.

Multiple stones are identified within the gallbladder which measure
up to 9 mm. Gallbladder wall enhancement and mild edema noted. Right
upper quadrant pericholecystic fluid is identified. The common bile
duct is nondilated measuring 4 mm. No convincing evidence for
choledocholithiasis.

Pancreas: Evaluation of the pancreas is significantly diminished due
to motion artifact. No findings to suggest main duct dilatation,
inflammation or mass.

Spleen:  Within normal limits in size and appearance.

Adrenals/Urinary Tract: No masses identified. No evidence of
hydronephrosis.

Stomach/Bowel: Stomach appears nondistended. No dilated loops of
bowel identified within the upper abdomen.

Vascular/Lymphatic: No pathologically enlarged lymph nodes
identified. No abdominal aortic aneurysm demonstrated.

Other: Right upper quadrant free fluid is identified and appears
increased from the previous exam.

Musculoskeletal: T12 compression deformity is again noted.
IMPRESSION: 1. Examination is significantly diminished due to motion artifact.
2. There are multiple gallstones identified within the gallbladder.
There is gallbladder wall enhancement and progressive right upper
quadrant and pericholecystic fluid. Cannot rule out acute
cholecystitis.
3. No convincing evidence for choledocholithiasis. No evidence for
CBD or main pancreatic duct dilatation.
4. Small to moderate bilateral pleural effusions.
